# Patient Record
Sex: Male | Born: 1945 | Race: White | Hispanic: No | Marital: Married | Smoking: Former smoker
Health system: Southern US, Community
[De-identification: ages and names within clinical notes are randomized; demographics above are authoritative.]

## PROBLEM LIST (undated history)

## (undated) DIAGNOSIS — I2510 Atherosclerotic heart disease of native coronary artery without angina pectoris: Secondary | ICD-10-CM

## (undated) DIAGNOSIS — N179 Acute kidney failure, unspecified: Secondary | ICD-10-CM

## (undated) DIAGNOSIS — M25552 Pain in left hip: Secondary | ICD-10-CM

## (undated) DIAGNOSIS — E119 Type 2 diabetes mellitus without complications: Secondary | ICD-10-CM

## (undated) DIAGNOSIS — R609 Edema, unspecified: Secondary | ICD-10-CM

## (undated) DIAGNOSIS — I480 Paroxysmal atrial fibrillation: Secondary | ICD-10-CM

## (undated) DIAGNOSIS — I739 Peripheral vascular disease, unspecified: Secondary | ICD-10-CM

## (undated) DIAGNOSIS — I10 Essential (primary) hypertension: Secondary | ICD-10-CM

## (undated) DIAGNOSIS — N400 Enlarged prostate without lower urinary tract symptoms: Secondary | ICD-10-CM

## (undated) DIAGNOSIS — E785 Hyperlipidemia, unspecified: Secondary | ICD-10-CM

## (undated) DIAGNOSIS — I639 Cerebral infarction, unspecified: Secondary | ICD-10-CM

## (undated) DIAGNOSIS — M25551 Pain in right hip: Secondary | ICD-10-CM

## (undated) DIAGNOSIS — Z951 Presence of aortocoronary bypass graft: Secondary | ICD-10-CM

## (undated) DIAGNOSIS — R748 Abnormal levels of other serum enzymes: Secondary | ICD-10-CM

## (undated) DIAGNOSIS — I509 Heart failure, unspecified: Secondary | ICD-10-CM

## (undated) DIAGNOSIS — D649 Anemia, unspecified: Secondary | ICD-10-CM

## (undated) DIAGNOSIS — R600 Localized edema: Secondary | ICD-10-CM

## (undated) DIAGNOSIS — G4730 Sleep apnea, unspecified: Secondary | ICD-10-CM

## (undated) HISTORY — DX: Hyperlipidemia, unspecified: E78.5

## (undated) HISTORY — DX: Localized edema: R60.0

## (undated) HISTORY — DX: Abnormal levels of other serum enzymes: R74.8

## (undated) HISTORY — DX: Acute kidney failure, unspecified: N17.9

## (undated) HISTORY — DX: Paroxysmal atrial fibrillation: I48.0

## (undated) HISTORY — DX: Pain in left hip: M25.552

## (undated) HISTORY — DX: Edema, unspecified: R60.9

## (undated) HISTORY — DX: Presence of aortocoronary bypass graft: Z95.1

## (undated) HISTORY — DX: Type 2 diabetes mellitus without complications: E11.9

## (undated) HISTORY — DX: Sleep apnea, unspecified: G47.30

## (undated) HISTORY — DX: Pain in right hip: M25.551

## (undated) HISTORY — DX: Peripheral vascular disease, unspecified: I73.9

## (undated) HISTORY — PX: OTHER SURGICAL HISTORY: SHX169

## (undated) HISTORY — DX: Anemia, unspecified: D64.9

## (undated) HISTORY — DX: Heart failure, unspecified: I50.9

## (undated) HISTORY — DX: Essential (primary) hypertension: I10

## (undated) HISTORY — DX: Cerebral infarction, unspecified: I63.9

## (undated) HISTORY — DX: Atherosclerotic heart disease of native coronary artery without angina pectoris: I25.10

## (undated) HISTORY — DX: Benign prostatic hyperplasia without lower urinary tract symptoms: N40.0

---

## 2017-07-13 DIAGNOSIS — N183 Chronic kidney disease, stage 3 unspecified: Secondary | ICD-10-CM | POA: Insufficient documentation

## 2019-06-11 DIAGNOSIS — G250 Essential tremor: Secondary | ICD-10-CM | POA: Insufficient documentation

## 2019-12-18 DIAGNOSIS — H9209 Otalgia, unspecified ear: Secondary | ICD-10-CM | POA: Insufficient documentation

## 2020-02-15 LAB — COLOGUARD: Cologuard: NEGATIVE

## 2020-04-30 ENCOUNTER — Encounter (INDEPENDENT_AMBULATORY_CARE_PROVIDER_SITE_OTHER): Payer: Medicare Other | Admitting: Ophthalmology

## 2020-04-30 ENCOUNTER — Other Ambulatory Visit: Payer: Self-pay

## 2020-04-30 DIAGNOSIS — E11311 Type 2 diabetes mellitus with unspecified diabetic retinopathy with macular edema: Secondary | ICD-10-CM | POA: Diagnosis not present

## 2020-04-30 DIAGNOSIS — H43813 Vitreous degeneration, bilateral: Secondary | ICD-10-CM

## 2020-04-30 DIAGNOSIS — I10 Essential (primary) hypertension: Secondary | ICD-10-CM

## 2020-04-30 DIAGNOSIS — H35371 Puckering of macula, right eye: Secondary | ICD-10-CM

## 2020-04-30 DIAGNOSIS — H35033 Hypertensive retinopathy, bilateral: Secondary | ICD-10-CM | POA: Diagnosis not present

## 2020-04-30 DIAGNOSIS — E113313 Type 2 diabetes mellitus with moderate nonproliferative diabetic retinopathy with macular edema, bilateral: Secondary | ICD-10-CM | POA: Diagnosis not present

## 2020-06-05 ENCOUNTER — Telehealth: Payer: Self-pay

## 2020-06-05 NOTE — Telephone Encounter (Signed)
NOTES ON FILE FROM  [facility] [phone] REFERRAL TO SCHEDULING

## 2020-08-06 ENCOUNTER — Other Ambulatory Visit: Payer: Self-pay

## 2020-08-06 ENCOUNTER — Encounter: Payer: Self-pay | Admitting: Cardiology

## 2020-08-06 ENCOUNTER — Ambulatory Visit: Payer: Medicare Other | Admitting: Cardiology

## 2020-08-06 VITALS — BP 124/68 | HR 68 | Ht 72.0 in | Wt 230.0 lb

## 2020-08-06 DIAGNOSIS — I2510 Atherosclerotic heart disease of native coronary artery without angina pectoris: Secondary | ICD-10-CM | POA: Diagnosis not present

## 2020-08-06 DIAGNOSIS — I5043 Acute on chronic combined systolic (congestive) and diastolic (congestive) heart failure: Secondary | ICD-10-CM | POA: Diagnosis not present

## 2020-08-06 NOTE — Progress Notes (Signed)
Cardiology Office Note:    Date:  08/06/2020   ID:  Corey Richards, DOB 1946/01/08, MRN [id number]  PCP:  Seward Carol, MD  [facility] Cardiologist:  Candee Furbish, MD  [facility] Electrophysiologist:  None   Referring MD: Catalina Antigua, MD    History of Present Illness:    Corey Richards is a 74 y.o. male here to establish care status post bypass x2 as well as peripheral arterial disease status post bilateral stents x2.  Had CABG in November 2018 as well as perioperative paroxysmal atrial fibrillation.  Amiodarone was stopped postoperatively and 30-day event monitor showed sinus rhythm with no atrial fibrillation.  Occasional PACs and PVCs were noted all asymptomatic.  No evidence of congestive heart failure no side effects with statin.  Soft murmur heard chronically.  Stroke in 2016 - Plavix stopped and had a stroke. OK to stop ASA.  Overall doing well.  They are downsizing, moving to be with children in [place].    Past Medical History:  Diagnosis Date  . Abnormal liver enzymes   . Acute renal failure (ARF) ([place])   . Anemia   . Benign prostatic hyperplasia   . CHF (congestive heart failure) ([place])   . Coronary arteriosclerosis   . Diabetes mellitus without complication ([place])   . Hyperlipidemia   . Hypertension   . Pain in joint of left hip   . Pain in joint of right hip   . Paroxysmal atrial fibrillation (HCC)   . Peripheral edema   . Peripheral vascular disease ([place])   . 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 Sleep apnea   . Stroke [facility])     Past Surgical History:  Procedure Laterality Date  . cataract surgery    . coronary artery bypass grafts N/A   . peripheral angiography Bilateral     Current Medications: Current Meds  Medication Sig  . atorvastatin (LIPITOR) 40 MG tablet Take 40 mg by mouth at bedtime.  . buprenorphine (BUTRANS) 10 MCG/HR PTWK Place 1 patch onto the skin once a week. Apply 1 patch every week by transdermal  route  . calcitRIOL (ROCALTROL) 0.25 MCG capsule Take 0.25 mcg by mouth daily.  . carvedilol (COREG) 12.5 MG tablet Take 12.5 mg by mouth 2 (two) times daily with a meal.  . clopidogrel (PLAVIX) 75 MG tablet Take 75 mg by mouth daily.  . empagliflozin (JARDIANCE) 10 MG TABS tablet Take 10 mg by mouth daily.  . furosemide (LASIX) 40 MG tablet Take 40 mg by mouth daily. Take 1 tab (40mg  total) in the AM  . primidone (MYSOLINE) 50 MG tablet Take 50 mg by mouth 2 (two) times daily after a meal.  . sacubitril-valsartan (ENTRESTO) 97-103 MG Take 1 tablet by mouth 2 (two) times daily.  . sodium bicarbonate 650 MG tablet Take 648 mg by mouth 4 (four) times daily.  Marland Kitchen terazosin (HYTRIN) 10 MG capsule Take 10 mg by mouth daily. Take 10mg  tab daily - along with 5mg  tab  . terazosin (HYTRIN) 5 MG capsule Take 5 mg by mouth daily. Take 5mg  tab along with 10mg  tab  . Vitamin D, Cholecalciferol, 25 MCG (1000 UT) CAPS Take 1 capsule by mouth daily.  . [DISCONTINUED] aspirin EC 81 MG tablet Take 81 mg by mouth daily. Swallow whole.     Allergies:   Patient has no known allergies.   Social History   Socioeconomic History  . Marital status: Married    Spouse  name: Not on file  . Number of children: Not on file  . Years of education: Not on file  . Highest education level: Not on file  Occupational History  . Not on file  Tobacco Use  . Smoking status: Former Smoker    Years: 10.00    Types: Cigars    Start date: 07/24/1969    Quit date: 07/24/1981    Years since quitting: 39.0  . Smokeless tobacco: Never Used  Vaping Use  . Vaping Use: Never used  Substance and Sexual Activity  . Alcohol use: Never  . Drug use: Not on file  . Sexual activity: Yes  Other Topics Concern  . Not on file  Social History Narrative  . Not on file   Social Determinants of Health   Financial Resource Strain:   . Difficulty of Paying Living Expenses: Not on file  Food Insecurity:   . Worried About Sales executive in the Last Year: Not on file  . Ran Out of Food in the Last Year: Not on file  Transportation Needs:   . Lack of Transportation (Medical): Not on file  . Lack of Transportation (Non-Medical): Not on file  Physical Activity:   . Days of Exercise per Week: Not on file  . Minutes of Exercise per Session: Not on file  Stress:   . Feeling of Stress : Not on file  Social Connections:   . Frequency of Communication with Friends and Family: Not on file  . Frequency of Social Gatherings with Friends and Family: Not on file  . Attends Religious Services: Not on file  . Active Member of Clubs or Organizations: Not on file  . Attends Archivist Meetings: Not on file  . Marital Status: Not on file     Family History: The patient's family history includes Alzheimer's disease in his father; Heart attack in his father.  ROS:   Please see the history of present illness.    No fevers chills nausea vomiting syncope bleeding all other systems reviewed and are negative.  EKGs/Labs/Other Studies Reviewed:    The following studies were reviewed today: Prior Zio monitor showed no atrial fibrillation  EKG:  EKG is  ordered today.  The ekg ordered today demonstrates sinus rhythm 68 poor R wave progression nonspecific ST-T wave changes  Recent Labs: No results found for requested labs within last 8760 hours.  Recent Lipid Panel No results found for: CHOL, TRIG, HDL, CHOLHDL, VLDL, LDLCALC, LDLDIRECT  Physical Exam:    VS:  BP 124/68   Pulse 68   Ht 6' (1.829 m)   Wt 230 lb (104.3 kg)   SpO2 98%   BMI 31.19 kg/m     Wt Readings from Last 3 Encounters:  08/06/20 230 lb (104.3 kg)     GEN:  Well nourished, well developed in no acute distress HEENT: Normal NECK: No JVD; No carotid bruits LYMPHATICS: No lymphadenopathy CARDIAC: RRR, no murmurs, rubs, gallops RESPIRATORY:  Clear to auscultation without rales, wheezing or rhonchi  ABDOMEN: Soft, non-tender,  non-distended MUSCULOSKELETAL:  No edema; No deformity  SKIN: Warm and dry NEUROLOGIC:  Alert and oriented x 3 PSYCHIATRIC:  Normal affect   ASSESSMENT:    1. Acute on chronic combined systolic and diastolic CHF (congestive heart failure) ([place])   2. Coronary artery disease involving native coronary artery of native heart without angina pectoris    PLAN:    In order of problems listed above:  Coronary  artery disease -Post bypass 2018.  Two-vessel.  Overall doing well without any anginal symptoms. -Had myocardial infarction in 2000 with an RCA stent placed 5 x 18 MultiLink, then MI in 2002 with stent placed to posterior lateral branch 2.5 x 13 MultiLink.  Paroxysmal atrial fibrillation postoperatively -Off of amiodarone.  Doing well.  Essential hypertension -Stable, medications reviewed.  Prior EKG showed sinus rhythm 63 late transition with inferior lateral T wave changes.  PVD --bilateral leg stents-continue with Plavix.  Prior stroke -Had a stroke after he had stopped his Plavix for 2 weeks.  We will continue with Plavix monotherapy.  I think it is reasonable to stop the aspirin 81 mg to help reduce bleeding risks.  He does have some bruising on his arm.  Diabetes with hyperlipidemia -Continue with atorvastatin.  LDL goal less than 70.     Medication Adjustments/Labs and Tests Ordered: Current medicines are reviewed at length with the patient today.  Concerns regarding medicines are outlined above.  Orders Placed This Encounter  Procedures  . EKG 12-Lead  . ECHOCARDIOGRAM COMPLETE   No orders of the defined types were placed in this encounter.   Patient Instructions  Medication Instructions:  Please discontinue your Aspirin.  Continue all other medications as listed.  *If you need a refill on your cardiac medications before your next appointment, please call your pharmacy*  Testing/Procedures: Your physician has requested that you have an echocardiogram.  Echocardiography is a painless test that uses sound waves to create images of your heart. It provides your doctor with information about the size and shape of your heart and how well your heart's chambers and valves are working. This procedure takes approximately one hour. There are no restrictions for this procedure.  Follow-Up: At [facility], you and your health needs are our priority.  As part of our continuing mission to provide you with exceptional heart care, we have created designated Provider Care Teams.  These Care Teams include your primary Cardiologist (physician) and Advanced Practice Providers (APPs -  Physician Assistants and Nurse Practitioners) who all work together to provide you with the care you need, when you need it.  We recommend signing up for the patient portal called "MyChart".  Sign up information is provided on this After Visit Summary.  MyChart is used to connect with patients for Virtual Visits (Telemedicine).  Patients are able to view lab/test results, encounter notes, upcoming appointments, etc.  Non-urgent messages can be sent to your provider as well.   To learn more about what you can do with MyChart, go to [email or web address].    Your next appointment:   6 month(s)  The format for your next appointment:   In Person  Provider:   Candee Furbish, MD   Thank you for choosing [facility]!!         Signed, Candee Furbish, MD  08/06/2020 3:45 PM    [place]

## 2020-08-06 NOTE — Patient Instructions (Addendum)
Medication Instructions:  Please discontinue your Aspirin.  Continue all other medications as listed.  *If you need a refill on your cardiac medications before your next appointment, please call your pharmacy*  Testing/Procedures: Your physician has requested that you have an echocardiogram. Echocardiography is a painless test that uses sound waves to create images of your heart. It provides your doctor with information about the size and shape of your heart and how well your heart's chambers and valves are working. This procedure takes approximately one hour. There are no restrictions for this procedure.  Follow-Up: At [facility], you and your health needs are our priority.  As part of our continuing mission to provide you with exceptional heart care, we have created designated Provider Care Teams.  These Care Teams include your primary Cardiologist (physician) and Advanced Practice Providers (APPs -  Physician Assistants and Nurse Practitioners) who all work together to provide you with the care you need, when you need it.  We recommend signing up for the patient portal called "MyChart".  Sign up information is provided on this After Visit Summary.  MyChart is used to connect with patients for Virtual Visits (Telemedicine).  Patients are able to view lab/test results, encounter notes, upcoming appointments, etc.  Non-urgent messages can be sent to your provider as well.   To learn more about what you can do with MyChart, go to [email or web address].    Your next appointment:   6 month(s)  The format for your next appointment:   In Person  Provider:   Candee Furbish, MD   Thank you for choosing [facility]!!

## 2020-08-24 ENCOUNTER — Ambulatory Visit (HOSPITAL_COMMUNITY): Payer: Medicare Other | Attending: Cardiology

## 2020-08-24 ENCOUNTER — Other Ambulatory Visit: Payer: Self-pay

## 2020-08-24 DIAGNOSIS — I2510 Atherosclerotic heart disease of native coronary artery without angina pectoris: Secondary | ICD-10-CM | POA: Insufficient documentation

## 2020-08-24 LAB — ECHOCARDIOGRAM COMPLETE
AR max vel: 1.76 cm2
AV Area VTI: 1.53 cm2
AV Area mean vel: 1.83 cm2
AV Mean grad: 4 mmHg
AV Peak grad: 7.5 mmHg
Ao pk vel: 1.37 m/s
Area-P 1/2: 3.5 cm2
S' Lateral: 5.18 cm

## 2020-09-07 ENCOUNTER — Encounter (INDEPENDENT_AMBULATORY_CARE_PROVIDER_SITE_OTHER): Payer: Medicare Other | Admitting: Ophthalmology

## 2020-09-28 ENCOUNTER — Other Ambulatory Visit: Payer: Self-pay | Admitting: *Deleted

## 2020-09-28 DIAGNOSIS — I739 Peripheral vascular disease, unspecified: Secondary | ICD-10-CM

## 2020-10-12 ENCOUNTER — Encounter (INDEPENDENT_AMBULATORY_CARE_PROVIDER_SITE_OTHER): Payer: Medicare Other | Admitting: Ophthalmology

## 2020-10-12 ENCOUNTER — Other Ambulatory Visit: Payer: Self-pay

## 2020-10-12 DIAGNOSIS — H35033 Hypertensive retinopathy, bilateral: Secondary | ICD-10-CM

## 2020-10-12 DIAGNOSIS — E113393 Type 2 diabetes mellitus with moderate nonproliferative diabetic retinopathy without macular edema, bilateral: Secondary | ICD-10-CM | POA: Diagnosis not present

## 2020-10-12 DIAGNOSIS — E11319 Type 2 diabetes mellitus with unspecified diabetic retinopathy without macular edema: Secondary | ICD-10-CM | POA: Diagnosis not present

## 2020-10-12 DIAGNOSIS — H43813 Vitreous degeneration, bilateral: Secondary | ICD-10-CM

## 2020-10-12 DIAGNOSIS — I10 Essential (primary) hypertension: Secondary | ICD-10-CM

## 2020-10-12 DIAGNOSIS — H35371 Puckering of macula, right eye: Secondary | ICD-10-CM

## 2020-10-13 ENCOUNTER — Ambulatory Visit (HOSPITAL_COMMUNITY)
Admission: RE | Admit: 2020-10-13 | Discharge: 2020-10-13 | Disposition: A | Discharge status: Home / Self Care | Payer: Medicare Other | Source: Ambulatory Visit | Attending: Vascular Surgery | Admitting: Vascular Surgery

## 2020-10-13 ENCOUNTER — Ambulatory Visit: Payer: Self-pay | Admitting: Vascular Surgery

## 2020-10-13 ENCOUNTER — Encounter: Payer: Self-pay | Admitting: Vascular Surgery

## 2020-10-13 DIAGNOSIS — I739 Peripheral vascular disease, unspecified: Secondary | ICD-10-CM | POA: Diagnosis present

## 2020-10-13 NOTE — Progress Notes (Signed)
Patient refused to be weighed. When checking patient in to see the Dr I reminded patient to keep his mask above his nose patient said no he cant breath with that on. Asked patient to change into shorts so Dr Carlis Abbott could exam him he refused I explained to him that Dr Carlis Abbott would need him to change so he can do his exam. Advised patient we are running behind and he had a patient to be seen ahead of him. Patient came out of the room and stated he wasn't going to wait and left without being seen by Dr Carlis Abbott.  Unfortunately patient left prior to being seen today.  Per nursing, he stated he did not want to wait any longer.  There will be no charge for today's visit.  Marty Heck, MD [facility] [place] Office: [phone]

## 2020-10-14 ENCOUNTER — Telehealth: Payer: Self-pay | Admitting: Cardiology

## 2020-10-14 ENCOUNTER — Other Ambulatory Visit: Payer: Self-pay | Admitting: Cardiology

## 2020-10-14 MED ORDER — TERAZOSIN HCL 5 MG PO CAPS
5.0000 mg | ORAL_CAPSULE | Freq: Every day | ORAL | 2 refills | Status: DC
Start: 2020-10-14 — End: 2021-02-01

## 2020-10-14 MED ORDER — TERAZOSIN HCL 10 MG PO CAPS
10.0000 mg | ORAL_CAPSULE | Freq: Every day | ORAL | 2 refills | Status: DC
Start: 2020-10-14 — End: 2021-02-01

## 2020-10-14 NOTE — Telephone Encounter (Signed)
Pt calling stating that he would like for Dr. Marlou Porch to refill his medications terazosin 5 mg and 10 mg capsules. Pt would like for Dr. Marlou Porch nurse to give him a call back concerning this matter. Pt also stated that his previous cardiologist prescribed this medication. Please address

## 2020-10-14 NOTE — Telephone Encounter (Signed)
Spoke with patient after verifying him by name and DOB.  Pt wanted to make sure his refills had been sent into [facility] as requested but then stated he had listened to my previous VM and knew it had been taken care of.  He was grateful for the call back.  He had no further requests or questions at the time of the call.

## 2020-10-14 NOTE — Telephone Encounter (Signed)
*  STAT* If patient is at the pharmacy, call can be transferred to refill team.   1. Which medications need to be refilled? (please list name of each medication and dose if known) terazosin (HYTRIN) 10 MG capsule  2. Which pharmacy/location (including street and city if local pharmacy) is medication to be sent to? [place], [address], [address]  3. Do they need a 30 day or 90 day supply? 90 day supply  Patient states he is completely out of medication.

## 2020-10-14 NOTE — Telephone Encounter (Signed)
New message:    Patient calling concerning some medication. Please call patient back. Patient would like for some one to also would like some one to call call [facility] [phone]

## 2020-10-14 NOTE — Telephone Encounter (Signed)
Attempted to contact patient by phone.  NA - lm  (OK per DPR on file) that refills will be sent into [facility] as requested as pt takes Hytrin for HTN.  Advised to c/b if any further questions or concerns.

## 2020-11-10 ENCOUNTER — Other Ambulatory Visit: Payer: Self-pay

## 2020-11-10 ENCOUNTER — Encounter: Payer: Self-pay | Admitting: Vascular Surgery

## 2020-11-10 ENCOUNTER — Ambulatory Visit (INDEPENDENT_AMBULATORY_CARE_PROVIDER_SITE_OTHER): Payer: Medicare Other | Admitting: Vascular Surgery

## 2020-11-10 VITALS — BP 143/73 | HR 71 | Temp 97.7°F | Resp 20 | Ht 72.0 in | Wt 240.0 lb

## 2020-11-10 DIAGNOSIS — I739 Peripheral vascular disease, unspecified: Secondary | ICD-10-CM

## 2020-11-10 NOTE — Progress Notes (Signed)
ASSESSMENT & PLAN:  74 y.o. male with currently asymptomatic peripheral arterial disease s/p endovascular intervention in [place].   Recommend:  Complete cessation from all tobacco products. Blood glucose control with goal A1c < 7%. Blood pressure control with goal blood pressure < 140/90 mmHg. Lipid reduction therapy with goal LDL-C <100 mg/dL (<70 if symptomatic from PAD).  Clopidogrel 75mg  PO QD. Atorvastatin 40-80mg  PO QD (or other "high intensity" statin therapy).  Follow up 1 year with repeat ABI and arterial duplex.  CHIEF COMPLAINT:   Establish vascular care  HISTORY:  HISTORY OF PRESENT ILLNESS: Corey Richards is a 74 y.o. male with history of DM, HF, HLA, HTN, AF, CAD s/p CABG. he has a strong history of peripheral arterial disease.  He is undergone multiple endovascular interventions in [place] near [place] with Dr Jaclyn Shaggy.  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  His right fifth toe is also surgically absent.  He is ambulatory.  He is a bit unsteady and ambulates with a cane.  He denies location, ischemic rest pain, ischemic ulceration.  Past Medical History:  Diagnosis Date  . Abnormal liver enzymes   . Acute renal failure (ARF) ([place])   . Anemia   . Benign prostatic hyperplasia   . CHF (congestive heart failure) ([place])   . Coronary arteriosclerosis   . Diabetes mellitus without complication ([place])   . Hyperlipidemia   . Hypertension   . Pain in joint of left hip   . Pain in joint of right hip   . Paroxysmal atrial fibrillation (HCC)   . Peripheral edema   . Peripheral vascular disease ([place])   . 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 Sleep apnea   . Stroke [facility])     Past Surgical History:  Procedure Laterality Date  . cataract surgery    . coronary artery bypass grafts N/A   . peripheral angiography Bilateral     Family History  Problem Relation Age of Onset  . Alzheimer's disease Father    . Heart attack Father     Social History   Socioeconomic History  . Marital status: Married    Spouse name: Not on file  . Number of children: Not on file  . Years of education: Not on file  . Highest education level: Not on file  Occupational History  . Not on file  Tobacco Use  . Smoking status: Former Smoker    Years: 10.00    Types: Cigars    Start date: 07/24/1969    Quit date: 07/24/1981    Years since quitting: 39.3  . Smokeless tobacco: Never Used  Vaping Use  . Vaping Use: Never used  Substance and Sexual Activity  . Alcohol use: Never  . Drug use: Not on file  . Sexual activity: Yes  Other Topics Concern  . Not on file  Social History Narrative  . Not on file   Social Determinants of Health   Financial Resource Strain:   . Difficulty of Paying Living Expenses: Not on file  Food Insecurity:   . Worried About Charity fundraiser in the Last Year: Not on file  . Ran Out of Food in the Last Year: Not on file  Transportation Needs:   . Lack of Transportation (Medical): Not on file  . Lack of Transportation (Non-Medical): Not on file  Physical Activity:   . Days of Exercise per Week: Not on  file  . Minutes of Exercise per Session: Not on file  Stress:   . Feeling of Stress : Not on file  Social Connections:   . Frequency of Communication with Friends and Family: Not on file  . Frequency of Social Gatherings with Friends and Family: Not on file  . Attends Religious Services: Not on file  . Active Member of Clubs or Organizations: Not on file  . Attends Archivist Meetings: Not on file  . Marital Status: Not on file  Intimate Partner Violence:   . Fear of Current or Ex-Partner: Not on file  . Emotionally Abused: Not on file  . Physically Abused: Not on file  . Sexually Abused: Not on file    No Known Allergies  Current Outpatient Medications  Medication Sig Dispense Refill  . atorvastatin (LIPITOR) 40 MG tablet Take 40 mg by mouth at  bedtime.    . buprenorphine (BUTRANS) 10 MCG/HR PTWK Place 1 patch onto the skin once a week. Apply 1 patch every week by transdermal route    . calcitRIOL (ROCALTROL) 0.25 MCG capsule Take 0.25 mcg by mouth daily.    . carvedilol (COREG) 12.5 MG tablet Take 12.5 mg by mouth 2 (two) times daily with a meal.    . clopidogrel (PLAVIX) 75 MG tablet Take 75 mg by mouth daily.    . empagliflozin (JARDIANCE) 10 MG TABS tablet Take 10 mg by mouth daily.    . furosemide (LASIX) 40 MG tablet Take 40 mg by mouth daily. Take 1 tab ($Remo'40mg'DULgY$  total) in the AM    . primidone (MYSOLINE) 50 MG tablet Take 50 mg by mouth 2 (two) times daily after a meal.    . sacubitril-valsartan (ENTRESTO) 97-103 MG Take 1 tablet by mouth 2 (two) times daily.    . sodium bicarbonate 650 MG tablet Take 648 mg by mouth 4 (four) times daily.    Marland Kitchen terazosin (HYTRIN) 10 MG capsule Take 1 capsule (10 mg total) by mouth daily. Take $RemoveBef'10mg'mUouMVcSok$  tab daily - along with $RemoveBe'5mg'jSzLzkehq$  tab 90 capsule 2  . terazosin (HYTRIN) 5 MG capsule Take 1 capsule (5 mg total) by mouth daily. Take $RemoveBef'5mg'yymbqWWdjb$  tab along with $RemoveBe'10mg'OyYRPViRO$  tab 90 capsule 2  . Vitamin D, Cholecalciferol, 25 MCG (1000 UT) CAPS Take 1 capsule by mouth daily.     No current facility-administered medications for this visit.    REVIEW OF SYSTEMS:  $RemoveB'[X]'HSVsZHZx$  denotes positive finding, $RemoveBeforeDEI'[ ]'VhSjJcCdrbyJXjXU$  denotes negative finding Cardiac  Comments:  Chest pain or chest pressure:    Shortness of breath upon exertion:    Short of breath when lying flat:    Irregular heart rhythm:        Vascular    Pain in calf, thigh, or hip brought on by ambulation:    Pain in feet at night that wakes you up from your sleep:     Blood clot in your veins:    Leg swelling:         Pulmonary    Oxygen at home:    Productive cough:     Wheezing:         Neurologic    Sudden weakness in arms or legs:     Sudden numbness in arms or legs:     Sudden onset of difficulty speaking or slurred speech:    Temporary loss of vision in one eye:       Problems with dizziness:         Gastrointestinal  Blood in stool:     Vomited blood:         Genitourinary    Burning when urinating:     Blood in urine:        Psychiatric    Major depression:         Hematologic    Bleeding problems:    Problems with blood clotting too easily:        Skin    Rashes or ulcers:        Constitutional    Fever or chills:     PHYSICAL EXAM:   Vitals:   11/10/20 1330  BP: (!) 143/73  Pulse: 71  Resp: 20  Temp: 97.7 F (36.5 C)  SpO2: 99%  Weight: 240 lb (108.9 kg)  Height: 6' (1.829 m)   Constitutional: Well appearing in no distress. Appears well nourished.  Neurologic: Normal gait and station. CN intact. No weakness. No sensory loss. Psychiatric: Mood and affect symmetric and appropriate. Eyes: No icterus. No conjunctival pallor. Ears, nose, throat: mucous membranes moist. Midline trachea. No carotid bruit. Cardiac: regular rate and rhythm.  Respiratory: unlabored. Abdominal: soft, non-tender, non-distended. No palpable pulsatile abdominal mass. Peripheral vascular:  Dorsalis pedis pulse: L absent / R absent  Posterior tibial pulse: L absent / R absent  <2s capillary refill  Left toes 1-5 surgically absent  Right 5th toe surgically absent Extremity: No edema. No cyanosis. No pallor.  Skin: No gangrene. No ulceration.  Lymphatic: No Stemmer's sign. No palpable lymphadenopathy.   DATA REVIEW:   ABI 10/13/20  LOWER EXTREMITY DOPPLER STUDY   Indications: Peripheral artery disease, and Left great toe amputation.   Other Factors: Hx Bilateral stenting at outside facility in [facility].  Comparison Study: no priors at this facility   Performing Technologist: June Leap RDMS, RVT     Examination Guidelines: A complete evaluation includes at minimum, Doppler  waveform signals and systolic blood pressure reading at the level of  bilateral  brachial, anterior tibial, and posterior tibial arteries, when vessel  segments  are  accessible. Bilateral testing is considered an integral part of a  complete  examination. Photoelectric Plethysmograph (PPG) waveforms and toe systolic  pressure readings are included as required and additional duplex testing  as  needed. Limited examinations for reoccurring indications may be performed  as  noted.     ABI Findings:  +---------+------------------+-----+----------+--------+  Right  Rt Pressure (mmHg)IndexWaveform Comment   +---------+------------------+-----+----------+--------+  Brachial 157                      +---------+------------------+-----+----------+--------+  ATA   189        1.15 monophasic      +---------+------------------+-----+----------+--------+  PTA   147        0.89 monophasic      +---------+------------------+-----+----------+--------+  Great Toe73        0.44 Abnormal       +---------+------------------+-----+----------+--------+   +--------+------------------+-----+-------------------+-------+  Left  Lt Pressure (mmHg)IndexWaveform      Comment  +--------+------------------+-----+-------------------+-------+  [id number]                           +--------+------------------+-----+-------------------+-------+  ATA   255        1.55 biphasic            +--------+------------------+-----+-------------------+-------+  PTA   81        0.49 dampened monophasic      +--------+------------------+-----+-------------------+-------+  Summary:  Right: Resting right ankle-brachial index is within normal range. ABIs are  unreliable.  Although ankle brachial indices are within normal limits (0.95-1.29),  arterial Doppler waveforms at the ankle suggest some component of arterial  occlusive disease.  Left: Resting left ankle-brachial index indicates noncompressible left   lower extremity arteries. Arterial Doppler waveforms at the ankle suggest  some component of arterial occlusive disease.        *See table(s) above for measurements and observations.   Yevonne Aline. Stanford Breed, MD [facility] Phone Number: [phone] 11/10/2020 1:35 PM

## 2020-11-16 ENCOUNTER — Other Ambulatory Visit: Payer: Self-pay

## 2020-11-16 ENCOUNTER — Telehealth: Payer: Self-pay | Admitting: Cardiology

## 2020-11-16 NOTE — Telephone Encounter (Signed)
Pt c/o medication issue:  1. Name of Medication: sacubitril-valsartan (ENTRESTO) 97-103 MG  2. How are you currently taking this medication (dosage and times per day)? 1 tablet twice a day  3. Are you having a reaction (difficulty breathing--STAT)? no  4. What is your medication issue? Patient states the office needs to call [employer] at [phone] so he can get the medication.

## 2020-11-16 NOTE — Telephone Encounter (Signed)
Attempted to contact patient regarding pt assistance for his Entresto.  Advised there is a form online that he can print out, complete and include all required information (financial info).  Once that has been completed Dr Marlou Porch will need to complete the RX information, sign, date and have faxed into the company for approval of patient assistance.  Requested he call back if questions/concerns.

## 2020-11-26 NOTE — Telephone Encounter (Signed)
Pt has not called back.  Will close this encounter for now and await a call back if pt needs an further assistance.

## 2020-11-27 NOTE — Telephone Encounter (Signed)
Pt Asst application for Delene Loll has been completed, signed and faxed to [employer].

## 2020-12-22 NOTE — Telephone Encounter (Signed)
I called and spoke to pt. I told him I would not be back in the office until Friday but that I would re-fax it for him at that time.

## 2020-12-22 NOTE — Telephone Encounter (Signed)
Patient is following up regarding the status of [employer] Patient Assistance request. He states, per [employer], they received his application, however, the print was illegible and blurry. He would like to know if the application can be resubmitted. Please advise.

## 2020-12-25 NOTE — Telephone Encounter (Signed)
I called pt to let him know that I found his paperwork and would re-fax it for him but he stated that he called [employer] back and they were able to accept the one they received and have approved him for another year.

## 2021-01-01 DIAGNOSIS — E119 Type 2 diabetes mellitus without complications: Secondary | ICD-10-CM | POA: Diagnosis not present

## 2021-01-01 DIAGNOSIS — Z794 Long term (current) use of insulin: Secondary | ICD-10-CM | POA: Diagnosis not present

## 2021-01-01 DIAGNOSIS — E1165 Type 2 diabetes mellitus with hyperglycemia: Secondary | ICD-10-CM | POA: Diagnosis not present

## 2021-02-01 ENCOUNTER — Telehealth: Payer: Self-pay | Admitting: Cardiology

## 2021-02-01 DIAGNOSIS — E119 Type 2 diabetes mellitus without complications: Secondary | ICD-10-CM | POA: Diagnosis not present

## 2021-02-01 DIAGNOSIS — E1165 Type 2 diabetes mellitus with hyperglycemia: Secondary | ICD-10-CM | POA: Diagnosis not present

## 2021-02-01 DIAGNOSIS — Z794 Long term (current) use of insulin: Secondary | ICD-10-CM | POA: Diagnosis not present

## 2021-02-01 NOTE — Telephone Encounter (Signed)
Pt c/o medication issue:  1. Name of Medication: furosemide (LASIX) 40 MG tablet   2. How are you currently taking this medication (dosage and times per day)? 2 tablets daily  3. Are you having a reaction (difficulty breathing--STAT)? no  4. What is your medication issue? Patient states he takes 2 tablets daily, not one tablet. He states he needs the new prescription sent to [facility].

## 2021-02-01 NOTE — Telephone Encounter (Signed)
Called patient back about his request. Patient stated he is taking lasix 40 mg by mouth daily, but sometimes he needs to take it twice daily for edema. Informed patient that our office has never filled this medications with prn dose for edema. Patient stated his last cardiologist did at [facility]. Informed patient that we would send message to Dr. Marlou Porch to see if we can get a new medication order to reflect this.

## 2021-02-01 NOTE — Telephone Encounter (Signed)
*  STAT* If patient is at the pharmacy, call can be transferred to refill team.   1. Which medications need to be refilled? (please list name of each medication and dose if known)  terazosin (HYTRIN) 5 MG capsule terazosin (HYTRIN) 10 MG capsule   2. Which pharmacy/location (including street and city if local pharmacy) is medication to be sent to? [place], [address], [address]  3. Do they need a 30 day or 90 day supply? 90 day

## 2021-02-02 MED ORDER — TERAZOSIN HCL 10 MG PO CAPS
10.0000 mg | ORAL_CAPSULE | Freq: Every day | ORAL | 2 refills | Status: DC
Start: 2021-02-02 — End: 2021-11-09

## 2021-02-02 MED ORDER — FUROSEMIDE 40 MG PO TABS
40.0000 mg | ORAL_TABLET | Freq: Every day | ORAL | 2 refills | Status: DC
Start: 2021-02-02 — End: 2021-04-27

## 2021-02-02 MED ORDER — TERAZOSIN HCL 5 MG PO CAPS
5.0000 mg | ORAL_CAPSULE | Freq: Every day | ORAL | 2 refills | Status: DC
Start: 2021-02-02 — End: 2021-11-09

## 2021-02-02 NOTE — Telephone Encounter (Signed)
Ordered refills for patient's lasix and terazosin.

## 2021-02-02 NOTE — Telephone Encounter (Signed)
I am comfortable with him getting as needed Lasix 40 mg as well.  Thank you. Candee Furbish, MD

## 2021-02-08 DIAGNOSIS — Z794 Long term (current) use of insulin: Secondary | ICD-10-CM | POA: Diagnosis not present

## 2021-02-08 DIAGNOSIS — I739 Peripheral vascular disease, unspecified: Secondary | ICD-10-CM | POA: Diagnosis not present

## 2021-02-08 DIAGNOSIS — I2510 Atherosclerotic heart disease of native coronary artery without angina pectoris: Secondary | ICD-10-CM | POA: Diagnosis not present

## 2021-02-08 DIAGNOSIS — E11649 Type 2 diabetes mellitus with hypoglycemia without coma: Secondary | ICD-10-CM | POA: Diagnosis not present

## 2021-02-08 DIAGNOSIS — Z7984 Long term (current) use of oral hypoglycemic drugs: Secondary | ICD-10-CM | POA: Diagnosis not present

## 2021-02-08 DIAGNOSIS — E113299 Type 2 diabetes mellitus with mild nonproliferative diabetic retinopathy without macular edema, unspecified eye: Secondary | ICD-10-CM | POA: Diagnosis not present

## 2021-02-09 ENCOUNTER — Ambulatory Visit: Payer: Medicare Other | Admitting: Cardiology

## 2021-02-09 ENCOUNTER — Other Ambulatory Visit: Payer: Self-pay

## 2021-02-09 ENCOUNTER — Encounter: Payer: Self-pay | Admitting: Cardiology

## 2021-02-09 VITALS — BP 120/60 | HR 76 | Ht 72.0 in | Wt 226.0 lb

## 2021-02-09 DIAGNOSIS — I5022 Chronic systolic (congestive) heart failure: Secondary | ICD-10-CM

## 2021-02-09 DIAGNOSIS — I739 Peripheral vascular disease, unspecified: Secondary | ICD-10-CM

## 2021-02-09 DIAGNOSIS — E1151 Type 2 diabetes mellitus with diabetic peripheral angiopathy without gangrene: Secondary | ICD-10-CM | POA: Diagnosis not present

## 2021-02-09 DIAGNOSIS — I2510 Atherosclerotic heart disease of native coronary artery without angina pectoris: Secondary | ICD-10-CM | POA: Diagnosis not present

## 2021-02-09 NOTE — Progress Notes (Signed)
Cardiology Office Note:    Date:  02/09/2021   ID:  Corey Richards, DOB 01-02-1946, MRN [id number]  PCP:  Seward Carol, MD   [place]  Cardiologist:  Candee Furbish, MD  Advanced Practice Provider:  No care team member to display Electrophysiologist:  None       Referring MD: Seward Carol, MD     History of Present Illness:    Corey Richards is a 75 y.o. male here for CAD follow-up post CABG November 2018 and perioperative paroxysmal atrial fibrillation.  Amiodarone finally stopped after 30-day event monitor showed sinus rhythm.  He had a stroke in 2016 prior to CABG.  Past Medical History:  Diagnosis Date  . Abnormal liver enzymes   . Acute renal failure (ARF) ([place])   . Anemia   . Benign prostatic hyperplasia   . CHF (congestive heart failure) ([place])   . Coronary arteriosclerosis   . Diabetes mellitus without complication ([place] [employer])   . Hyperlipidemia   . Hypertension   . Pain in joint of left hip   . Pain in joint of right hip   . Paroxysmal atrial fibrillation (HCC)   . Peripheral edema   . Peripheral vascular disease ([place])   . 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 Sleep apnea   . Stroke [facility])     Past Surgical History:  Procedure Laterality Date  . cataract surgery    . coronary artery bypass grafts N/A   . peripheral angiography Bilateral     Current Medications: Current Meds  Medication Sig  . atorvastatin (LIPITOR) 40 MG tablet Take 40 mg by mouth at bedtime.  . calcitRIOL (ROCALTROL) 0.25 MCG capsule Take 0.25 mcg by mouth daily.  . carvedilol (COREG) 12.5 MG tablet Take 12.5 mg by mouth 2 (two) times daily with a meal.  . clopidogrel (PLAVIX) 75 MG tablet Take 75 mg by mouth daily.  . Dulaglutide (TRULICITY) 5.88 FO/2.7XA SOPN Inject 0.5 mLs into the skin once a week.  . empagliflozin (JARDIANCE) 10 MG TABS tablet Take 10 mg by mouth daily.  . furosemide (LASIX) 40 MG tablet Take 1 tablet (40 mg total) by  mouth daily. Can take extra 40 mg by mouth daily as needed for edema.  . primidone (MYSOLINE) 50 MG tablet Take 50 mg by mouth 2 (two) times daily after a meal.  . sacubitril-valsartan (ENTRESTO) 97-103 MG Take 1 tablet by mouth 2 (two) times daily.  . sodium bicarbonate 650 MG tablet Take 648 mg by mouth 4 (four) times daily.  Marland Kitchen terazosin (HYTRIN) 10 MG capsule Take 1 capsule (10 mg total) by mouth daily. Take 10mg  tab daily - along with 5mg  tab  . terazosin (HYTRIN) 5 MG capsule Take 1 capsule (5 mg total) by mouth daily. Take 5mg  tab along with 10mg  tab  . Vitamin D, Cholecalciferol, 25 MCG (1000 UT) CAPS Take 1 capsule by mouth daily.     Allergies:   Patient has no known allergies.   Social History   Socioeconomic History  . Marital status: Married    Spouse name: Not on file  . Number of children: Not on file  . Years of education: Not on file  . Highest education level: Not on file  Occupational History  . Not on file  Tobacco Use  . Smoking status: Former Smoker    Years: 10.00    Types: Cigars    Start date: 07/24/1969    Quit date:  07/24/1981    Years since quitting: 39.5  . Smokeless tobacco: Never Used  Vaping Use  . Vaping Use: Never used  Substance and Sexual Activity  . Alcohol use: Never  . Drug use: Not on file  . Sexual activity: Yes  Other Topics Concern  . Not on file  Social History Narrative  . Not on file   Social Determinants of Health   Financial Resource Strain: Not on file  Food Insecurity: Not on file  Transportation Needs: Not on file  Physical Activity: Not on file  Stress: Not on file  Social Connections: Not on file     Family History: The patient's family history includes Alzheimer's disease in his father; Heart attack in his father.  ROS:   Please see the history of present illness.     All other systems reviewed and are negative.  EKGs/Labs/Other Studies Reviewed:    The following studies were reviewed today:  ECHO  2021: 1. Technically challenging study. There is global hypokinesis, but there  also appears to be focal areas of more severe hypokinesis as well.  However, not all walls able to be well visualized. Recommend future  studies be done with echo contrast.. Left  ventricular ejection fraction, by estimation, is 35 to 40%. The left  ventricle has moderately decreased function. The left ventricle  demonstrates regional wall motion abnormalities (see scoring  diagram/findings for description). The left ventricular  internal cavity size was moderately to severely dilated. Left ventricular  diastolic parameters are indeterminate.  2. Right ventricular systolic function was not well visualized. The right  ventricular size is not well visualized. Tricuspid regurgitation signal is  inadequate for assessing PA pressure.  3. Left atrial size was moderately dilated.  4. Right atrial size was moderately dilated.  5. The mitral valve is grossly normal. Trivial mitral valve  regurgitation. No evidence of mitral stenosis.  6. The aortic valve is tricuspid. There is moderate calcification of the  aortic valve. There is mild thickening of the aortic valve. Aortic valve  regurgitation is not visualized. Mild to moderate aortic valve  sclerosis/calcification is present, without  any evidence of aortic stenosis.   Vascular ABIs:10/2020 Right: Resting right ankle-brachial index is within normal range. ABIs are  unreliable.  Although ankle brachial indices are within normal limits (0.95-1.29),  arterial Doppler waveforms at the ankle suggest some component of arterial  occlusive disease.  Left: Resting left ankle-brachial index indicates noncompressible left  lower extremity arteries. Arterial Doppler waveforms at the ankle suggest  some component of arterial occlusive disease.    Recent Labs: No results found for requested labs within last 8760 hours.  Recent Lipid Panel No results found for:  CHOL, TRIG, HDL, CHOLHDL, VLDL, LDLCALC, LDLDIRECT   Risk Assessment/Calculations:      Physical Exam:    VS:  BP 120/60 (BP Location: Left Arm, Patient Position: Sitting, Cuff Size: Normal)   Pulse 76   Ht 6' (1.829 m)   Wt 226 lb (102.5 kg)   SpO2 96%   BMI 30.65 kg/m     Wt Readings from Last 3 Encounters:  02/09/21 226 lb (102.5 kg)  11/10/20 240 lb (108.9 kg)  10/13/20 235 lb (106.6 kg)     GEN:  Well nourished, well developed in no acute distress, ambulates with a cane HEENT: Normal NECK: No JVD; No carotid bruits LYMPHATICS: No lymphadenopathy CARDIAC: RRR, no murmurs, rubs, gallops RESPIRATORY:  Clear to auscultation without rales, wheezing or rhonchi  ABDOMEN:  Soft, non-tender, non-distended MUSCULOSKELETAL:  No edema; No deformity  SKIN: Warm and dry NEUROLOGIC:  Alert and oriented x 3 PSYCHIATRIC:  Normal affect   ASSESSMENT:    1. Coronary artery disease involving native coronary artery of native heart without angina pectoris   2. PVD (peripheral vascular disease) ([place])   3. Chronic systolic heart failure ([place])   4. 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  On excellent medications.  He is not on spironolactone because of chronic kidney disease stage IV.  Continue with Entresto, carvedilol, Jardiance, Lasix  Hyperlipidemia -On atorvastatin 40 mg high intensity statin, excellent.  Diabetes with peripheral vascular disease -New startTrulicity -On Jardiance  Peripheral vascular disease -Currently seeing vascular surgery, Dr. Stanford Breed excellent.  ABIs reviewed as above.  Medication Adjustments/Labs and Tests Ordered: Current medicines are reviewed at length with the patient today.  Concerns regarding medicines are outlined above.  No orders of the defined types were placed in this encounter.  No orders of the defined types  were placed in this encounter.   Patient Instructions  Medication Instructions:  The current medical regimen is effective;  continue present plan and medications.  *If you need a refill on your cardiac medications before your next appointment, please call your pharmacy*  Follow-Up: At [facility], you and your health needs are our priority.  As part of our continuing mission to provide you with exceptional heart care, we have created designated Provider Care Teams.  These Care Teams include your primary Cardiologist (physician) and Advanced Practice Providers (APPs -  Physician Assistants and Nurse Practitioners) who all work together to provide you with the care you need, when you need it.  We recommend signing up for the patient portal called "MyChart".  Sign up information is provided on this After Visit Summary.  MyChart is used to connect with patients for Virtual Visits (Telemedicine).  Patients are able to view lab/test results, encounter notes, upcoming appointments, etc.  Non-urgent messages can be sent to your provider as well.   To learn more about what you can do with MyChart, go to [email or web address].    Your next appointment:   6 month(s)  The format for your next appointment:   In Person  Provider:   Candee Furbish, MD   Thank you for choosing [facility]!!        Signed, Candee Furbish, MD  02/09/2021 1:57 PM    [place]

## 2021-02-09 NOTE — Patient Instructions (Signed)

## 2021-02-15 DIAGNOSIS — I429 Cardiomyopathy, unspecified: Secondary | ICD-10-CM | POA: Diagnosis not present

## 2021-02-15 DIAGNOSIS — N2581 Secondary hyperparathyroidism of renal origin: Secondary | ICD-10-CM | POA: Diagnosis not present

## 2021-02-15 DIAGNOSIS — D631 Anemia in chronic kidney disease: Secondary | ICD-10-CM | POA: Diagnosis not present

## 2021-02-15 DIAGNOSIS — E1122 Type 2 diabetes mellitus with diabetic chronic kidney disease: Secondary | ICD-10-CM | POA: Diagnosis not present

## 2021-02-15 DIAGNOSIS — N189 Chronic kidney disease, unspecified: Secondary | ICD-10-CM | POA: Diagnosis not present

## 2021-02-15 DIAGNOSIS — R809 Proteinuria, unspecified: Secondary | ICD-10-CM | POA: Diagnosis not present

## 2021-02-15 DIAGNOSIS — I509 Heart failure, unspecified: Secondary | ICD-10-CM | POA: Diagnosis not present

## 2021-02-15 DIAGNOSIS — N1832 Chronic kidney disease, stage 3b: Secondary | ICD-10-CM | POA: Diagnosis not present

## 2021-02-25 ENCOUNTER — Other Ambulatory Visit: Payer: Self-pay | Admitting: Cardiology

## 2021-02-25 ENCOUNTER — Telehealth: Payer: Self-pay | Admitting: Cardiology

## 2021-02-25 MED ORDER — EMPAGLIFLOZIN 10 MG PO TABS
10.0000 mg | ORAL_TABLET | Freq: Every day | ORAL | 2 refills | Status: DC
Start: 2021-02-25 — End: 2021-03-08

## 2021-02-25 NOTE — Telephone Encounter (Signed)
Reviewed with Dr Marlou Porch who gives approval for refills.  Rx printed and signed by Dr Marlou Porch.  Will be faxed to # as requested.

## 2021-02-25 NOTE — Telephone Encounter (Signed)
Called pt to notify him Dr Marlou Porch approved refills for [facility] and that I had faxed to RX to the # he left so that everything could be expedited.  Pt reports he was not calling for a refill, he is calling about the status of his patient assistance paperwork.  He reports he left it here (at our office) in 12/2019 and again 12/2020.  Advised pt I have not received any paperwork for assistance with Jardiance and do not see anything in his chart regarding any assistance.  I do see where there was paperwork completed for assistance with Entresto.    I will forward this information to [facility] Via, LPN to see if perhaps she may be aware of it.

## 2021-02-25 NOTE — Telephone Encounter (Signed)
*  STAT* If patient is at the pharmacy, call can be transferred to refill team.   1. Which medications need to be refilled? (please list name of each medication and dose if known)  empagliflozin (JARDIANCE) 10 MG TABS tablet  2. Which pharmacy/location (including street and city if local pharmacy) is medication to be sent to? [place], [place] - [address], [address]  3. Do they need a 30 day or 90 day supply? 90   Patient provided [place] with an urgent fax number to send the RX to. The urgent fax # is [phone]. This will help the refill request be expedited. He only has 9 pills left

## 2021-02-25 NOTE — Telephone Encounter (Signed)
*  STAT* If patient is at the pharmacy, call can be transferred to refill team.   1. Which medications need to be refilled? (please list name of each medication and dose if known) empagliflozin (JARDIANCE) 10 MG TABS tablet  2. Which pharmacy/location (including street and city if local pharmacy) is medication to be sent to? [place], [address], [address]  [phone]  3. Do they need a 30 day or 90 day supply? 90  Patient states he is almost out of medication. Please advise

## 2021-02-25 NOTE — Telephone Encounter (Signed)
Called pt to inform him that he needed to contact his endocrinologist for a refill on his jardiance and pt stated that he talked to Dr. Marlou Porch nurse concerning this matter. Pt would like Dr. Marlou Porch nurse Jeannene Patella, RN, to give him a call back concerning this matter. Please address

## 2021-02-25 NOTE — Telephone Encounter (Signed)
Pt is requesting a refill on jardiance. Would Dr. Marlou Porch like to refill this medication? Please address

## 2021-02-26 NOTE — Telephone Encounter (Signed)
**Note De-Identified Angelita Harnack Obfuscation** The pt did email me his completed application but it is printing out too small to read. I emailed him back and requested that he try e-mailing to me one more time and if I cannot print that one for him to bring his entire application to Dr Marlou Porch office to drop off when he comes to pick up his Jardiance samples.

## 2021-02-26 NOTE — Telephone Encounter (Signed)
**Note De-Identified [name] Obfuscation** The pt states that he emailed his [employer] Pt Asst application to Dr Marlou Porch nurse X 2 and that his wife brought his application to the office last month and still [employer] is telling them they have not received it. He states he is down to 8 Jardiance tablets at this time. I asked him to email his application to me (I did provide him my email address) so I can get this taken care of ASAP. He states that he will email it to me today and thanked me for calling him to discuss.

## 2021-02-26 NOTE — Telephone Encounter (Addendum)
Corey Richards and I have not done anything with it. There is nothing in Dr. Kingsley Plan box. We can provide patient samples if he is running out.

## 2021-02-26 NOTE — Telephone Encounter (Signed)
**Note De-Identified Corey Richards Obfuscation** The pt emailed me a [employer] Pt Asst application for his Jardiance but sections 1, 2, 3, and 4 (the pts part of the application) was not filled in.  I called him to discuss and he states he was in a hurry trying to email it to me that he did not realize that he emailed me a blank one and that he will complete it and re email it back to me.  I recommended that he take his time completing his part of the application and email to me once it is ready and that there is no rush as we have left him 3 Jardiance 10 mg sample boxes in the front office at Dr Marlou Porch office for him to pick up which should be enough until we can get this taken care of.  He thanked me for our asst with this and for the samples we are providing him.

## 2021-03-01 NOTE — Telephone Encounter (Addendum)
**Note De-Identified Tyah Acord Obfuscation** The pt let his [employer] pt asst application at the office. I completed the provider page of the application and have emailed all to our DOD of the day, Dr Francesca Oman nurse (Dr Marlou Porch will not be back in office until next week) so she can obtain her signature, date it, and to fax to [employer] at fax number written on cover letter included or to take to Medical Records to place in nurses box to be faxed.

## 2021-03-02 NOTE — Telephone Encounter (Signed)
Pt assistance forms printed off and given to DOD Dr. Meda Coffee to sign and date.  Forms were placed in nurses fax box in medical records, to be faxed to the contact information provided on cover sheet.

## 2021-03-03 NOTE — Telephone Encounter (Signed)
Received fax that pt application is missing his proof of income

## 2021-03-04 NOTE — Telephone Encounter (Signed)
**Note De-Identified Enora Trillo Obfuscation** I have sent the pt the following email: Hi Mr Kabat, Just wanted you to know that we received a letter Harshini Trent fax from [employer] stating that your patient assistance application is missing your proof of income. Your application cannot go through their process without this information.  Please contact them to discuss at [phone]. Thank you, Jeani Hawking

## 2021-03-08 ENCOUNTER — Telehealth: Payer: Self-pay | Admitting: Cardiology

## 2021-03-08 DIAGNOSIS — E119 Type 2 diabetes mellitus without complications: Secondary | ICD-10-CM | POA: Diagnosis not present

## 2021-03-08 DIAGNOSIS — Z794 Long term (current) use of insulin: Secondary | ICD-10-CM | POA: Diagnosis not present

## 2021-03-08 DIAGNOSIS — E1165 Type 2 diabetes mellitus with hyperglycemia: Secondary | ICD-10-CM | POA: Diagnosis not present

## 2021-03-08 MED ORDER — EMPAGLIFLOZIN 10 MG PO TABS
10.0000 mg | ORAL_TABLET | Freq: Every day | ORAL | 2 refills | Status: DC
Start: 2021-03-08 — End: 2021-10-21

## 2021-03-08 NOTE — Telephone Encounter (Signed)
*  STAT* If patient is at the pharmacy, call can be transferred to refill team.   1. Which medications need to be refilled? (please list name of each medication and dose if known) Jardiace   2. Which pharmacy/location (including street and city if local pharmacy) is medication to be sent to? [phone] [facility]  3. Do they need a 30 day or 90 day supply? 90  Patient also states some one suppose to help him to get this medication he needs assistant.

## 2021-03-08 NOTE — Telephone Encounter (Signed)
**Note De-Identified Ryman Rathgeber Obfuscation** The pt wanted me to know that he was denied asst for Jardiance through [facility] because his income exceeds the program limit. He is requesting that his Jardiance refill be sent to [facility] pharmacy to fill as it is less expensive if he gets it from them.  He is aware that I have e-scribed his Jardiance RX to [facility] #90 with 2 refills. He thanked me for my help with this.

## 2021-03-08 NOTE — Telephone Encounter (Signed)
Hey Lynn, LPN, can you please advise on this matter? Thanks  ?

## 2021-03-12 ENCOUNTER — Encounter (INDEPENDENT_AMBULATORY_CARE_PROVIDER_SITE_OTHER): Payer: Medicare Other | Admitting: Ophthalmology

## 2021-03-17 ENCOUNTER — Encounter (INDEPENDENT_AMBULATORY_CARE_PROVIDER_SITE_OTHER): Payer: Medicare Other | Admitting: Ophthalmology

## 2021-03-17 ENCOUNTER — Other Ambulatory Visit: Payer: Self-pay

## 2021-03-17 DIAGNOSIS — H35033 Hypertensive retinopathy, bilateral: Secondary | ICD-10-CM | POA: Diagnosis not present

## 2021-03-17 DIAGNOSIS — H43813 Vitreous degeneration, bilateral: Secondary | ICD-10-CM | POA: Diagnosis not present

## 2021-03-17 DIAGNOSIS — I10 Essential (primary) hypertension: Secondary | ICD-10-CM

## 2021-03-17 DIAGNOSIS — H35371 Puckering of macula, right eye: Secondary | ICD-10-CM

## 2021-03-17 DIAGNOSIS — E113393 Type 2 diabetes mellitus with moderate nonproliferative diabetic retinopathy without macular edema, bilateral: Secondary | ICD-10-CM

## 2021-04-08 DIAGNOSIS — Z794 Long term (current) use of insulin: Secondary | ICD-10-CM | POA: Diagnosis not present

## 2021-04-08 DIAGNOSIS — E119 Type 2 diabetes mellitus without complications: Secondary | ICD-10-CM | POA: Diagnosis not present

## 2021-04-08 DIAGNOSIS — E1165 Type 2 diabetes mellitus with hyperglycemia: Secondary | ICD-10-CM | POA: Diagnosis not present

## 2021-04-26 ENCOUNTER — Other Ambulatory Visit: Payer: Self-pay | Admitting: Cardiology

## 2021-05-08 DIAGNOSIS — E1165 Type 2 diabetes mellitus with hyperglycemia: Secondary | ICD-10-CM | POA: Diagnosis not present

## 2021-05-08 DIAGNOSIS — Z794 Long term (current) use of insulin: Secondary | ICD-10-CM | POA: Diagnosis not present

## 2021-05-08 DIAGNOSIS — E119 Type 2 diabetes mellitus without complications: Secondary | ICD-10-CM | POA: Diagnosis not present

## 2021-05-26 DIAGNOSIS — G4733 Obstructive sleep apnea (adult) (pediatric): Secondary | ICD-10-CM | POA: Diagnosis not present

## 2021-06-04 DIAGNOSIS — E119 Type 2 diabetes mellitus without complications: Secondary | ICD-10-CM | POA: Diagnosis not present

## 2021-06-04 DIAGNOSIS — Z794 Long term (current) use of insulin: Secondary | ICD-10-CM | POA: Diagnosis not present

## 2021-06-04 DIAGNOSIS — E1165 Type 2 diabetes mellitus with hyperglycemia: Secondary | ICD-10-CM | POA: Diagnosis not present

## 2021-06-30 ENCOUNTER — Other Ambulatory Visit: Payer: Self-pay

## 2021-07-01 ENCOUNTER — Encounter: Payer: Self-pay | Admitting: Family Medicine

## 2021-07-01 ENCOUNTER — Ambulatory Visit (INDEPENDENT_AMBULATORY_CARE_PROVIDER_SITE_OTHER): Payer: Medicare Other | Admitting: Family Medicine

## 2021-07-01 VITALS — BP 138/74 | HR 97 | Temp 97.1°F | Ht 73.0 in | Wt 241.4 lb

## 2021-07-01 DIAGNOSIS — N189 Chronic kidney disease, unspecified: Secondary | ICD-10-CM | POA: Insufficient documentation

## 2021-07-01 DIAGNOSIS — G8929 Other chronic pain: Secondary | ICD-10-CM | POA: Diagnosis not present

## 2021-07-01 DIAGNOSIS — I739 Peripheral vascular disease, unspecified: Secondary | ICD-10-CM

## 2021-07-01 DIAGNOSIS — Z8673 Personal history of transient ischemic attack (TIA), and cerebral infarction without residual deficits: Secondary | ICD-10-CM | POA: Diagnosis not present

## 2021-07-01 DIAGNOSIS — N1832 Chronic kidney disease, stage 3b: Secondary | ICD-10-CM | POA: Insufficient documentation

## 2021-07-01 DIAGNOSIS — I10 Essential (primary) hypertension: Secondary | ICD-10-CM

## 2021-07-01 DIAGNOSIS — I509 Heart failure, unspecified: Secondary | ICD-10-CM | POA: Diagnosis not present

## 2021-07-01 DIAGNOSIS — I2510 Atherosclerotic heart disease of native coronary artery without angina pectoris: Secondary | ICD-10-CM | POA: Diagnosis not present

## 2021-07-01 DIAGNOSIS — L858 Other specified epidermal thickening: Secondary | ICD-10-CM

## 2021-07-01 DIAGNOSIS — E1122 Type 2 diabetes mellitus with diabetic chronic kidney disease: Secondary | ICD-10-CM

## 2021-07-01 DIAGNOSIS — E559 Vitamin D deficiency, unspecified: Secondary | ICD-10-CM | POA: Diagnosis not present

## 2021-07-01 DIAGNOSIS — Z125 Encounter for screening for malignant neoplasm of prostate: Secondary | ICD-10-CM | POA: Diagnosis not present

## 2021-07-01 DIAGNOSIS — M545 Low back pain, unspecified: Secondary | ICD-10-CM | POA: Diagnosis not present

## 2021-07-01 DIAGNOSIS — Z0000 Encounter for general adult medical examination without abnormal findings: Secondary | ICD-10-CM | POA: Insufficient documentation

## 2021-07-01 NOTE — Progress Notes (Signed)
Established Patient Office Visit  Subjective:  Patient ID: Corey Richards, male    DOB: 03/10/1946  Age: 75 y.o. MRN: [id number]  CC:  Chief Complaint  Patient presents with   Establish Care    NP/establish care, no concerns. Would like referral to dermatologist.     HPI Corey Richards presents for establishment of care.  He is accompanied by his wife today.  This has not been a good year for him.  Earlier he underwent back surgery and developed subsequent osteomyelitis.  Antibiotic treatment was associated with an acute kidney injury.  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  He has had stents placed in his lower extremity arteries.  Carries a diagnosis of CHF with an ejection fracture of 35%.  He is status post TIAs.  Continues to see nephrology for CKD.  He has a bump on his left lower lip that is been present for 3 weeks now after he cut himself shaving.  He has been left chronically weak with chronic back pain and semi ambulatory.   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    Sleep apnea    Stroke [facility])     Past Surgical History:  Procedure Laterality Date   cataract surgery     coronary artery bypass grafts N/A    peripheral angiography Bilateral     Family History  Problem Relation Age of Onset   Alzheimer's disease Father    Heart attack Father     Social History   Socioeconomic History   Marital status: Married    Spouse name: Not on file   Number of children: Not on  file   Years of education: Not on file   Highest education level: Not on file  Occupational History   Not on file  Tobacco Use   Smoking status: Former    Types: Cigars    Start date: 07/24/1969    Quit date: 07/24/1981    Years since quitting: 39.9   Smokeless tobacco: Never  Vaping Use   Vaping Use: Never used  Substance and Sexual Activity   Alcohol use: Never   Drug use: Not on file   Sexual activity: Yes  Other Topics Concern   Not on file  Social History Narrative   Not on file   Social Determinants of Health   Financial Resource Strain: Not on file  Food Insecurity: Not on file  Transportation Needs: Not on file  Physical Activity: Not on file  Stress: Not on file  Social Connections: Not on file  Intimate Partner Violence: Not on file    Outpatient Medications Prior to Visit  Medication Sig Dispense Refill   atorvastatin (LIPITOR) 40 MG tablet Take 40 mg by mouth at bedtime.     calcitRIOL (ROCALTROL) 0.25 MCG capsule Take 0.25 mcg by mouth daily.     carvedilol (COREG) 12.5 MG tablet Take  12.5 mg by mouth 2 (two) times daily with a meal.     clopidogrel (PLAVIX) 75 MG tablet Take 75 mg by mouth daily.     Dulaglutide (TRULICITY) 1.61 WR/6.0AV SOPN Inject 0.5 mLs into the skin once a week.     empagliflozin (JARDIANCE) 10 MG TABS tablet Take 1 tablet (10 mg total) by mouth daily. 90 tablet 2   furosemide (LASIX) 40 MG tablet TAKE 1 TABLET BY MOUTH  DAILY , CAN TAKE EXTRA  TABLET DAILY AS NEEDED FOR  EDEMA 135 tablet 3   primidone (MYSOLINE) 50 MG tablet Take 50 mg by mouth 2 (two) times daily after a meal.     sacubitril-valsartan (ENTRESTO) 97-103 MG Take 1 tablet by mouth 2 (two) times daily.     sodium bicarbonate 650 MG tablet Take 648 mg by mouth 4 (four) times daily.     terazosin (HYTRIN) 10 MG capsule Take 1 capsule (10 mg total) by mouth daily. Take $RemoveBef'10mg'psQYKxXWZL$  tab daily - along with $RemoveBe'5mg'yHmzeeDkz$  tab 90 capsule 2   terazosin (HYTRIN) 5 MG capsule Take 1 capsule (5 mg  total) by mouth daily. Take $RemoveBef'5mg'hZkWqOQSMD$  tab along with $RemoveBe'10mg'DzoNYAggQ$  tab 90 capsule 2   Vitamin D, Cholecalciferol, 25 MCG (1000 UT) CAPS Take 1 capsule by mouth daily.     No facility-administered medications prior to visit.    No Known Allergies  ROS Review of Systems  Constitutional:  Negative for diaphoresis, fatigue, fever and unexpected weight change.  HENT: Negative.    Eyes:  Negative for photophobia and visual disturbance.  Respiratory:  Positive for apnea. Negative for cough.   Cardiovascular:  Negative for chest pain.  Gastrointestinal: Negative.   Genitourinary:  Negative for difficulty urinating, frequency and urgency.  Musculoskeletal:  Positive for back pain, gait problem and myalgias.  Skin:  Positive for wound.  Neurological:  Positive for weakness. Negative for speech difficulty.  Psychiatric/Behavioral: Negative.       Objective:    Physical Exam Vitals and nursing note reviewed.  Constitutional:      General: He is not in acute distress.    Appearance: Normal appearance. He is obese. He is not ill-appearing, toxic-appearing or diaphoretic.  HENT:     Head: Normocephalic and atraumatic.     Right Ear: Tympanic membrane, ear canal and external ear normal.     Left Ear: Tympanic membrane, ear canal and external ear normal.     Mouth/Throat:     Mouth: Mucous membranes are dry.     Pharynx: Oropharynx is clear. No oropharyngeal exudate or posterior oropharyngeal erythema.  Eyes:     General: No scleral icterus.       Right eye: No discharge.        Left eye: No discharge.     Extraocular Movements: Extraocular movements intact.     Conjunctiva/sclera: Conjunctivae normal.     Pupils: Pupils are equal, round, and reactive to light.  Neck:     Vascular: No carotid bruit.  Cardiovascular:     Rate and Rhythm: Normal rate and regular rhythm.  Pulmonary:     Effort: Pulmonary effort is normal. No respiratory distress.     Breath sounds: Normal breath sounds. No wheezing or  rhonchi.  Abdominal:     General: Abdomen is flat. Bowel sounds are normal. There is no distension.     Palpations: Abdomen is soft. There is no mass.     Tenderness: There is no abdominal tenderness. There is no guarding  or rebound.     Hernia: A hernia is present. Hernia is present in the ventral area.  Musculoskeletal:     Cervical back: No rigidity or tenderness.  Lymphadenopathy:     Cervical: No cervical adenopathy.  Skin:    General: Skin is warm and dry.       Neurological:     Mental Status: He is alert and oriented to person, place, and time.  Psychiatric:        Mood and Affect: Mood normal.        Behavior: Behavior normal.    BP 138/74   Pulse 97   Temp (!) 97.1 F (36.2 C) (Temporal)   Ht $R'6\' 1"'EY$  (1.854 m)   Wt 241 lb 6.4 oz (109.5 kg)   SpO2 98%   BMI 31.85 kg/m  Wt Readings from Last 3 Encounters:  07/01/21 241 lb 6.4 oz (109.5 kg)  02/09/21 226 lb (102.5 kg)  11/10/20 240 lb (108.9 kg)     Health Maintenance Due  Topic Date Due   HEMOGLOBIN A1C  Never done   FOOT EXAM  Never done   OPHTHALMOLOGY EXAM  Never done   Hepatitis C Screening  Never done   TETANUS/TDAP  Never done   Zoster Vaccines- Shingrix (1 of 2) Never done   PNA vac Low Risk Adult (1 of 2 - PCV13) Never done    There are no preventive care reminders to display for this patient.  No results found for: TSH No results found for: WBC, HGB, HCT, MCV, PLT No results found for: NA, K, CHLORIDE, CO2, GLUCOSE, BUN, CREATININE, BILITOT, ALKPHOS, AST, ALT, PROT, ALBUMIN, CALCIUM, ANIONGAP, EGFR, GFR No results found for: CHOL No results found for: HDL No results found for: LDLCALC No results found for: TRIG No results found for: CHOLHDL No results found for: HGBA1C    Assessment & Plan:   Problem List Items Addressed This Visit       Cardiovascular and Mediastinum   PAD (peripheral artery disease) ([place]) - Primary   Relevant Orders   CBC   Comprehensive metabolic panel   Lipid  panel   Essential hypertension   Relevant Orders   CBC   Comprehensive metabolic panel   Urinalysis, Routine w reflex microscopic   ASCVD (arteriosclerotic cardiovascular disease)   Relevant Orders   CBC   Comprehensive metabolic panel   Lipid panel   Congestive heart failure ([place])   Relevant Orders   Lipid panel     Endocrine   Type 2 diabetes mellitus with stage 3b chronic kidney disease, without long-term current use of insulin ([place])   Relevant Orders   Comprehensive metabolic panel   Hemoglobin A1c   Urinalysis, Routine w reflex microscopic     Musculoskeletal and Integument   Keratoacanthoma   Relevant Orders   Ambulatory referral to Dermatology     Genitourinary   Chronic kidney disease   Relevant Orders   CBC   Comprehensive metabolic panel   Urinalysis, Routine w reflex microscopic     Other   Healthcare maintenance   Relevant Orders   PSA   History of TIA (transient ischemic attack)   Relevant Orders   Lipid panel   Vitamin D deficiency   Relevant Orders   VITAMIN D 25 Hydroxy (Vit-D Deficiency, Fractures)   Chronic low back pain    No orders of the defined types were placed in this encounter.   Follow-up: Return in about 3 months (around 10/01/2021).  Advise Shingrix vaccine.  Follow-up in 3 months. Libby Maw, MD

## 2021-07-02 ENCOUNTER — Other Ambulatory Visit (INDEPENDENT_AMBULATORY_CARE_PROVIDER_SITE_OTHER): Payer: Medicare Other

## 2021-07-02 ENCOUNTER — Other Ambulatory Visit: Payer: Self-pay

## 2021-07-02 ENCOUNTER — Telehealth: Payer: Self-pay | Admitting: Family Medicine

## 2021-07-02 DIAGNOSIS — Z0000 Encounter for general adult medical examination without abnormal findings: Secondary | ICD-10-CM | POA: Diagnosis not present

## 2021-07-02 LAB — COMPREHENSIVE METABOLIC PANEL
ALT: 11 U/L (ref 0–53)
AST: 11 U/L (ref 0–37)
Albumin: 4.5 g/dL (ref 3.5–5.2)
Alkaline Phosphatase: 43 U/L (ref 39–117)
BUN: 97 mg/dL (ref 6–23)
CO2: 32 mEq/L (ref 19–32)
Calcium: 10.8 mg/dL — ABNORMAL HIGH (ref 8.4–10.5)
Chloride: 95 mEq/L — ABNORMAL LOW (ref 96–112)
Creatinine, Ser: 3.01 mg/dL — ABNORMAL HIGH (ref 0.40–1.50)
GFR: 19.76 mL/min — ABNORMAL LOW (ref >60.00)
Glucose, Bld: 167 mg/dL — ABNORMAL HIGH (ref 70–99)
Potassium: 4.5 mEq/L (ref 3.5–5.1)
Sodium: 138 mEq/L (ref 135–145)
Total Bilirubin: 0.3 mg/dL (ref 0.2–1.2)
Total Protein: 6.8 g/dL (ref 6.0–8.3)

## 2021-07-02 LAB — PSA: PSA: 2.66 ng/mL (ref 0.10–4.00)

## 2021-07-02 LAB — URINALYSIS, ROUTINE W REFLEX MICROSCOPIC
Bilirubin Urine: NEGATIVE
Hgb urine dipstick: NEGATIVE
Ketones, ur: NEGATIVE
Nitrite: NEGATIVE
Specific Gravity, Urine: 1.015 (ref 1.000–1.030)
Total Protein, Urine: NEGATIVE
Urine Glucose: >=1000 — AB
Urobilinogen, UA: 0.2 (ref 0.0–1.0)
pH: 5.5 (ref 5.0–8.0)

## 2021-07-02 LAB — CBC
HCT: 39.3 % (ref 39.0–52.0)
Hemoglobin: 13.1 g/dL (ref 13.0–17.0)
MCHC: 33.3 g/dL (ref 30.0–36.0)
MCV: 93.2 fl (ref 78.0–100.0)
Platelets: 150 10*3/uL (ref 150.0–400.0)
RBC: 4.22 Mil/uL (ref 4.22–5.81)
RDW: 14 % (ref 11.5–15.5)
WBC: 4.1 10*3/uL (ref 4.0–10.5)

## 2021-07-02 LAB — LIPID PANEL
Cholesterol: 156 mg/dL (ref 0–200)
HDL: 31 mg/dL — ABNORMAL LOW (ref >39.00)
NonHDL: 124.56
Total CHOL/HDL Ratio: 5
Triglycerides: 324 mg/dL — ABNORMAL HIGH (ref 0.0–149.0)
VLDL: 64.8 mg/dL — ABNORMAL HIGH (ref 0.0–40.0)

## 2021-07-02 LAB — HEMOGLOBIN A1C: Hgb A1c MFr Bld: 7.5 % — ABNORMAL HIGH (ref 4.6–6.5)

## 2021-07-02 LAB — VITAMIN D 25 HYDROXY (VIT D DEFICIENCY, FRACTURES): VITD: 77.01 ng/mL (ref 30.00–100.00)

## 2021-07-02 LAB — LDL CHOLESTEROL, DIRECT: Direct LDL: 71 mg/dL

## 2021-07-02 NOTE — Telephone Encounter (Signed)
Critical lab resulted on pt. BUN =97, creat = 3.01. no prior labs for comparison available in epic or care everywhere. Pt with known CKD stage 3b and follows with nephrology Dr. Justin Mend. Results appear to be decreased from baseline as GFR = 19.76. pt seen yesterday by PCP to establish care. No issues noted re: decreased urine output. Tried to call pt but no answer and VM box is full so not able to leave message. No answer on wife's cell number.  Will route to PCP as FYI and PCP's MA to call pt on Monday AM and recommend call to and f/u with nephro Dr. Justin Mend.  Component     Latest Ref Rng & Units 07/02/2021  Sodium     135 - 145 mEq/L 138  Potassium     3.5 - 5.1 mEq/L 4.5  Chloride     96 - 112 mEq/L 95 (L)  CO2     19 - 32 mEq/L 32  Glucose     70 - 99 mg/dL 167 (H)  BUN     6 - 23 mg/dL 97 (HH)  Creatinine     0.40 - 1.50 mg/dL 3.01 (H)  Total Bilirubin     0.2 - 1.2 mg/dL 0.3  Alkaline Phosphatase     39 - 117 U/L 43  AST     0 - 37 U/L 11  ALT     0 - 53 U/L 11  Total Protein     6.0 - 8.3 g/dL 6.8  Albumin     3.5 - 5.2 g/dL 4.5  GFR     >60.00 mL/min 19.76 (L)  Calcium     8.4 - 10.5 mg/dL 10.8 (H)

## 2021-07-02 NOTE — Addendum Note (Signed)
Addended by: Lynnea Ferrier on: 07/02/2021 09:40 AM   Modules accepted: Orders

## 2021-07-04 DIAGNOSIS — Z794 Long term (current) use of insulin: Secondary | ICD-10-CM | POA: Diagnosis not present

## 2021-07-04 DIAGNOSIS — E1165 Type 2 diabetes mellitus with hyperglycemia: Secondary | ICD-10-CM | POA: Diagnosis not present

## 2021-07-04 DIAGNOSIS — E119 Type 2 diabetes mellitus without complications: Secondary | ICD-10-CM | POA: Diagnosis not present

## 2021-07-05 NOTE — Progress Notes (Signed)
Labs are "okay" I think. GFR or kidney function is low at 19. He is a new patient for me and I know that he is seeing nephrology for chronic kidney disease. Is this normal for him. I have no other labs for him.

## 2021-07-07 ENCOUNTER — Other Ambulatory Visit: Payer: Self-pay | Admitting: *Deleted

## 2021-07-07 NOTE — Telephone Encounter (Signed)
Pt called into the refill room requesting a hand written prescription for his Delene Loll so he can mail it to [place] to get filled.  Per pt, he can get it filled there for a 1/3 of the cost he does here.   I will route to Dr. Marlou Porch / Rexene Agent, RN to make sure this is ok.  Pt understands someone will call him when it's ready.  PEr pt, can either pick it up or we can mail it to him.

## 2021-07-07 NOTE — Telephone Encounter (Signed)
Patient called and spoken with he agrees to schedule appointment with nephrologist.

## 2021-07-08 DIAGNOSIS — E1122 Type 2 diabetes mellitus with diabetic chronic kidney disease: Secondary | ICD-10-CM | POA: Diagnosis not present

## 2021-07-08 DIAGNOSIS — R809 Proteinuria, unspecified: Secondary | ICD-10-CM | POA: Diagnosis not present

## 2021-07-08 DIAGNOSIS — I429 Cardiomyopathy, unspecified: Secondary | ICD-10-CM | POA: Diagnosis not present

## 2021-07-08 DIAGNOSIS — N1832 Chronic kidney disease, stage 3b: Secondary | ICD-10-CM | POA: Diagnosis not present

## 2021-07-08 DIAGNOSIS — I509 Heart failure, unspecified: Secondary | ICD-10-CM | POA: Diagnosis not present

## 2021-07-08 DIAGNOSIS — N2581 Secondary hyperparathyroidism of renal origin: Secondary | ICD-10-CM | POA: Diagnosis not present

## 2021-07-08 DIAGNOSIS — D631 Anemia in chronic kidney disease: Secondary | ICD-10-CM | POA: Diagnosis not present

## 2021-07-09 ENCOUNTER — Other Ambulatory Visit: Payer: Self-pay | Admitting: Nephrology

## 2021-07-09 DIAGNOSIS — N1832 Chronic kidney disease, stage 3b: Secondary | ICD-10-CM

## 2021-07-13 DIAGNOSIS — D485 Neoplasm of uncertain behavior of skin: Secondary | ICD-10-CM | POA: Diagnosis not present

## 2021-07-13 DIAGNOSIS — C4401 Basal cell carcinoma of skin of lip: Secondary | ICD-10-CM | POA: Diagnosis not present

## 2021-07-14 MED ORDER — SACUBITRIL-VALSARTAN 97-103 MG PO TABS: 1.0000 | ORAL_TABLET | Freq: Two times a day (BID) | ORAL | 3 refills | Status: DC

## 2021-07-14 NOTE — Telephone Encounter (Signed)
Left message for pt to call back as to whether he wants to p/u or have RX mailed to his home address.

## 2021-07-16 NOTE — Telephone Encounter (Signed)
Never heard back from pt.  RX mailed to pt's home address.

## 2021-07-19 ENCOUNTER — Ambulatory Visit
Admission: RE | Admit: 2021-07-19 | Discharge: 2021-07-19 | Disposition: A | Discharge status: Home / Self Care | Payer: Medicare Other | Source: Ambulatory Visit | Attending: Nephrology | Admitting: Nephrology

## 2021-07-19 DIAGNOSIS — N189 Chronic kidney disease, unspecified: Secondary | ICD-10-CM | POA: Diagnosis not present

## 2021-07-19 DIAGNOSIS — N1832 Chronic kidney disease, stage 3b: Secondary | ICD-10-CM

## 2021-07-26 ENCOUNTER — Other Ambulatory Visit: Payer: Medicare Other

## 2021-08-03 ENCOUNTER — Telehealth: Payer: Self-pay | Admitting: Family Medicine

## 2021-08-03 NOTE — Telephone Encounter (Signed)
Pt wife dropped off form for Dr Ethelene Hal to sign and asked if there was anyway she can pick it up tomorrow. Please advise. I put in Dr Bebe Shaggy folder up front

## 2021-08-04 DIAGNOSIS — E119 Type 2 diabetes mellitus without complications: Secondary | ICD-10-CM | POA: Diagnosis not present

## 2021-08-04 DIAGNOSIS — E1165 Type 2 diabetes mellitus with hyperglycemia: Secondary | ICD-10-CM | POA: Diagnosis not present

## 2021-08-04 DIAGNOSIS — Z794 Long term (current) use of insulin: Secondary | ICD-10-CM | POA: Diagnosis not present

## 2021-08-04 NOTE — Telephone Encounter (Signed)
Form signed and up front for pick up

## 2021-08-09 DIAGNOSIS — E113299 Type 2 diabetes mellitus with mild nonproliferative diabetic retinopathy without macular edema, unspecified eye: Secondary | ICD-10-CM | POA: Diagnosis not present

## 2021-08-09 DIAGNOSIS — I2510 Atherosclerotic heart disease of native coronary artery without angina pectoris: Secondary | ICD-10-CM | POA: Diagnosis not present

## 2021-08-09 DIAGNOSIS — E1151 Type 2 diabetes mellitus with diabetic peripheral angiopathy without gangrene: Secondary | ICD-10-CM | POA: Diagnosis not present

## 2021-08-09 DIAGNOSIS — Z79899 Other long term (current) drug therapy: Secondary | ICD-10-CM | POA: Diagnosis not present

## 2021-08-09 DIAGNOSIS — E1165 Type 2 diabetes mellitus with hyperglycemia: Secondary | ICD-10-CM | POA: Diagnosis not present

## 2021-08-09 DIAGNOSIS — Z7984 Long term (current) use of oral hypoglycemic drugs: Secondary | ICD-10-CM | POA: Diagnosis not present

## 2021-08-10 ENCOUNTER — Ambulatory Visit: Payer: Medicare Other | Admitting: Cardiology

## 2021-08-10 DIAGNOSIS — C4402 Squamous cell carcinoma of skin of lip: Secondary | ICD-10-CM | POA: Diagnosis not present

## 2021-08-17 DIAGNOSIS — E1122 Type 2 diabetes mellitus with diabetic chronic kidney disease: Secondary | ICD-10-CM | POA: Diagnosis not present

## 2021-08-17 DIAGNOSIS — I509 Heart failure, unspecified: Secondary | ICD-10-CM | POA: Diagnosis not present

## 2021-08-17 DIAGNOSIS — I429 Cardiomyopathy, unspecified: Secondary | ICD-10-CM | POA: Diagnosis not present

## 2021-08-17 DIAGNOSIS — D631 Anemia in chronic kidney disease: Secondary | ICD-10-CM | POA: Diagnosis not present

## 2021-08-17 DIAGNOSIS — N2581 Secondary hyperparathyroidism of renal origin: Secondary | ICD-10-CM | POA: Diagnosis not present

## 2021-08-17 DIAGNOSIS — R809 Proteinuria, unspecified: Secondary | ICD-10-CM | POA: Diagnosis not present

## 2021-08-17 DIAGNOSIS — N1832 Chronic kidney disease, stage 3b: Secondary | ICD-10-CM | POA: Diagnosis not present

## 2021-08-18 ENCOUNTER — Encounter (INDEPENDENT_AMBULATORY_CARE_PROVIDER_SITE_OTHER): Payer: Medicare Other | Admitting: Ophthalmology

## 2021-08-18 ENCOUNTER — Other Ambulatory Visit: Payer: Self-pay

## 2021-08-18 DIAGNOSIS — H43813 Vitreous degeneration, bilateral: Secondary | ICD-10-CM

## 2021-08-18 DIAGNOSIS — E113393 Type 2 diabetes mellitus with moderate nonproliferative diabetic retinopathy without macular edema, bilateral: Secondary | ICD-10-CM | POA: Diagnosis not present

## 2021-08-18 DIAGNOSIS — H35371 Puckering of macula, right eye: Secondary | ICD-10-CM | POA: Diagnosis not present

## 2021-08-18 DIAGNOSIS — H35033 Hypertensive retinopathy, bilateral: Secondary | ICD-10-CM

## 2021-08-18 DIAGNOSIS — I10 Essential (primary) hypertension: Secondary | ICD-10-CM

## 2021-08-25 DIAGNOSIS — N1832 Chronic kidney disease, stage 3b: Secondary | ICD-10-CM | POA: Diagnosis not present

## 2021-09-08 DIAGNOSIS — E119 Type 2 diabetes mellitus without complications: Secondary | ICD-10-CM | POA: Diagnosis not present

## 2021-09-08 DIAGNOSIS — E1165 Type 2 diabetes mellitus with hyperglycemia: Secondary | ICD-10-CM | POA: Diagnosis not present

## 2021-09-08 DIAGNOSIS — Z794 Long term (current) use of insulin: Secondary | ICD-10-CM | POA: Diagnosis not present

## 2021-09-15 DIAGNOSIS — R809 Proteinuria, unspecified: Secondary | ICD-10-CM | POA: Diagnosis not present

## 2021-09-15 DIAGNOSIS — N2581 Secondary hyperparathyroidism of renal origin: Secondary | ICD-10-CM | POA: Diagnosis not present

## 2021-09-15 DIAGNOSIS — D631 Anemia in chronic kidney disease: Secondary | ICD-10-CM | POA: Diagnosis not present

## 2021-09-15 DIAGNOSIS — I509 Heart failure, unspecified: Secondary | ICD-10-CM | POA: Diagnosis not present

## 2021-09-15 DIAGNOSIS — E1122 Type 2 diabetes mellitus with diabetic chronic kidney disease: Secondary | ICD-10-CM | POA: Diagnosis not present

## 2021-09-15 DIAGNOSIS — N1832 Chronic kidney disease, stage 3b: Secondary | ICD-10-CM | POA: Diagnosis not present

## 2021-09-15 DIAGNOSIS — I429 Cardiomyopathy, unspecified: Secondary | ICD-10-CM | POA: Diagnosis not present

## 2021-09-29 DIAGNOSIS — N1832 Chronic kidney disease, stage 3b: Secondary | ICD-10-CM | POA: Diagnosis not present

## 2021-10-01 ENCOUNTER — Ambulatory Visit: Payer: Medicare Other | Admitting: Family Medicine

## 2021-10-08 DIAGNOSIS — Z794 Long term (current) use of insulin: Secondary | ICD-10-CM | POA: Diagnosis not present

## 2021-10-08 DIAGNOSIS — E119 Type 2 diabetes mellitus without complications: Secondary | ICD-10-CM | POA: Diagnosis not present

## 2021-10-08 DIAGNOSIS — E1165 Type 2 diabetes mellitus with hyperglycemia: Secondary | ICD-10-CM | POA: Diagnosis not present

## 2021-10-14 ENCOUNTER — Other Ambulatory Visit: Payer: Self-pay

## 2021-10-14 MED ORDER — CARVEDILOL 12.5 MG PO TABS
12.5000 mg | ORAL_TABLET | Freq: Two times a day (BID) | ORAL | 1 refills | Status: DC
Start: 2021-10-14 — End: 2021-10-14

## 2021-10-14 MED ORDER — CARVEDILOL 12.5 MG PO TABS
12.5000 mg | ORAL_TABLET | Freq: Two times a day (BID) | ORAL | 1 refills | Status: DC
Start: 2021-10-14 — End: 2022-02-24

## 2021-10-21 ENCOUNTER — Other Ambulatory Visit: Payer: Self-pay | Admitting: Cardiology

## 2021-10-21 MED ORDER — EMPAGLIFLOZIN 10 MG PO TABS
10.0000 mg | ORAL_TABLET | Freq: Every day | ORAL | 1 refills | Status: DC
Start: 2021-10-21 — End: 2022-02-22

## 2021-10-26 ENCOUNTER — Telehealth: Payer: Self-pay | Admitting: Family Medicine

## 2021-10-26 NOTE — Telephone Encounter (Signed)
Left message for patient to call back and schedule Medicare Annual Wellness Visit (AWV) in office.  ° °If not able to come in office, please offer to do virtually or by telephone.  Left office number and my jabber #[phone]. ° °Due for AWVI ° °Please schedule at anytime with Nurse Health Advisor. °  °

## 2021-11-05 ENCOUNTER — Other Ambulatory Visit: Payer: Self-pay | Admitting: Cardiology

## 2021-11-08 DIAGNOSIS — Z794 Long term (current) use of insulin: Secondary | ICD-10-CM | POA: Diagnosis not present

## 2021-11-08 DIAGNOSIS — E1165 Type 2 diabetes mellitus with hyperglycemia: Secondary | ICD-10-CM | POA: Diagnosis not present

## 2021-11-08 DIAGNOSIS — E119 Type 2 diabetes mellitus without complications: Secondary | ICD-10-CM | POA: Diagnosis not present

## 2021-11-15 DIAGNOSIS — Z794 Long term (current) use of insulin: Secondary | ICD-10-CM | POA: Diagnosis not present

## 2021-11-15 DIAGNOSIS — Z7984 Long term (current) use of oral hypoglycemic drugs: Secondary | ICD-10-CM | POA: Diagnosis not present

## 2021-11-15 DIAGNOSIS — E1151 Type 2 diabetes mellitus with diabetic peripheral angiopathy without gangrene: Secondary | ICD-10-CM | POA: Diagnosis not present

## 2021-11-15 DIAGNOSIS — E113299 Type 2 diabetes mellitus with mild nonproliferative diabetic retinopathy without macular edema, unspecified eye: Secondary | ICD-10-CM | POA: Diagnosis not present

## 2021-11-18 DIAGNOSIS — D631 Anemia in chronic kidney disease: Secondary | ICD-10-CM | POA: Diagnosis not present

## 2021-11-18 DIAGNOSIS — N1832 Chronic kidney disease, stage 3b: Secondary | ICD-10-CM | POA: Diagnosis not present

## 2021-11-18 DIAGNOSIS — E1122 Type 2 diabetes mellitus with diabetic chronic kidney disease: Secondary | ICD-10-CM | POA: Diagnosis not present

## 2021-11-18 DIAGNOSIS — I429 Cardiomyopathy, unspecified: Secondary | ICD-10-CM | POA: Diagnosis not present

## 2021-11-18 DIAGNOSIS — R809 Proteinuria, unspecified: Secondary | ICD-10-CM | POA: Diagnosis not present

## 2021-11-18 DIAGNOSIS — N2581 Secondary hyperparathyroidism of renal origin: Secondary | ICD-10-CM | POA: Diagnosis not present

## 2021-11-18 DIAGNOSIS — I509 Heart failure, unspecified: Secondary | ICD-10-CM | POA: Diagnosis not present

## 2021-11-25 DIAGNOSIS — L570 Actinic keratosis: Secondary | ICD-10-CM | POA: Diagnosis not present

## 2021-11-25 DIAGNOSIS — L720 Epidermal cyst: Secondary | ICD-10-CM | POA: Diagnosis not present

## 2021-11-25 DIAGNOSIS — D485 Neoplasm of uncertain behavior of skin: Secondary | ICD-10-CM | POA: Diagnosis not present

## 2021-11-25 DIAGNOSIS — L821 Other seborrheic keratosis: Secondary | ICD-10-CM | POA: Diagnosis not present

## 2021-11-25 DIAGNOSIS — D225 Melanocytic nevi of trunk: Secondary | ICD-10-CM | POA: Diagnosis not present

## 2021-11-25 DIAGNOSIS — D692 Other nonthrombocytopenic purpura: Secondary | ICD-10-CM | POA: Diagnosis not present

## 2021-11-25 DIAGNOSIS — Z85828 Personal history of other malignant neoplasm of skin: Secondary | ICD-10-CM | POA: Diagnosis not present

## 2021-12-02 ENCOUNTER — Other Ambulatory Visit: Payer: Self-pay

## 2021-12-02 DIAGNOSIS — L988 Other specified disorders of the skin and subcutaneous tissue: Secondary | ICD-10-CM | POA: Diagnosis not present

## 2021-12-02 DIAGNOSIS — D485 Neoplasm of uncertain behavior of skin: Secondary | ICD-10-CM | POA: Diagnosis not present

## 2021-12-02 MED ORDER — CLOPIDOGREL BISULFATE 75 MG PO TABS
75.0000 mg | ORAL_TABLET | Freq: Every day | ORAL | 0 refills | Status: DC
Start: 2021-12-02 — End: 2022-02-02

## 2021-12-14 ENCOUNTER — Ambulatory Visit (INDEPENDENT_AMBULATORY_CARE_PROVIDER_SITE_OTHER): Payer: Medicare Other

## 2021-12-14 DIAGNOSIS — Z0000 Encounter for general adult medical examination without abnormal findings: Secondary | ICD-10-CM

## 2021-12-14 NOTE — Patient Instructions (Signed)
Corey Richards , Thank you for taking time to come for your Medicare Wellness Visit. I appreciate your ongoing commitment to your health goals. Please review the following plan we discussed and let me know if I can assist you in the future.   Screening recommendations/referrals: Colonoscopy: 04/26/2015 Recommended yearly ophthalmology/optometry visit for glaucoma screening and checkup Recommended yearly dental visit for hygiene and checkup  Vaccinations: Influenza vaccine: completed  Pneumococcal vaccine: due  Tdap vaccine: due  Shingles vaccine: unknown     Advanced directives: yes   Conditions/risks identified: none   Next appointment: none   Preventive Care 35 Years and Older, Male Preventive care refers to lifestyle choices and visits with your health care provider that can promote health and wellness. What does preventive care include? A yearly physical exam. This is also called an annual well check. Dental exams once or twice a year. Routine eye exams. Ask your health care provider how often you should have your eyes checked. Personal lifestyle choices, including: Daily care of your teeth and gums. Regular physical activity. Eating a healthy diet. Avoiding tobacco and drug use. Limiting alcohol use. Practicing safe sex. Taking low doses of aspirin every day. Taking vitamin and mineral supplements as recommended by your health care provider. What happens during an annual well check? The services and screenings done by your health care provider during your annual well check will depend on your age, overall health, lifestyle risk factors, and family history of disease. Counseling  Your health care provider may ask you questions about your: Alcohol use. Tobacco use. Drug use. Emotional well-being. Home and relationship well-being. Sexual activity. Eating habits. History of falls. Memory and ability to understand (cognition). Work and work Statistician. Screening  You  may have the following tests or measurements: Height, weight, and BMI. Blood pressure. Lipid and cholesterol levels. These may be checked every 5 years, or more frequently if you are over 70 years old. Skin check. Lung cancer screening. You may have this screening every year starting at age 25 if you have a 30-pack-year history of smoking and currently smoke or have quit within the past 15 years. Fecal occult blood test (FOBT) of the stool. You may have this test every year starting at age 31. Flexible sigmoidoscopy or colonoscopy. You may have a sigmoidoscopy every 5 years or a colonoscopy every 10 years starting at age 82. Prostate cancer screening. Recommendations will vary depending on your family history and other risks. Hepatitis C blood test. Hepatitis B blood test. Sexually transmitted disease (STD) testing. Diabetes screening. This is done by checking your blood sugar (glucose) after you have not eaten for a while (fasting). You may have this done every 1-3 years. Abdominal aortic aneurysm (AAA) screening. You may need this if you are a current or former smoker. Osteoporosis. You may be screened starting at age 20 if you are at high risk. Talk with your health care provider about your test results, treatment options, and if necessary, the need for more tests. Vaccines  Your health care provider may recommend certain vaccines, such as: Influenza vaccine. This is recommended every year. Tetanus, diphtheria, and acellular pertussis (Tdap, Td) vaccine. You may need a Td booster every 10 years. Zoster vaccine. You may need this after age 88. Pneumococcal 13-valent conjugate (PCV13) vaccine. One dose is recommended after age 48. Pneumococcal polysaccharide (PPSV23) vaccine. One dose is recommended after age 5. Talk to your health care provider about which screenings and vaccines you need and how often you  need them. This information is not intended to replace advice given to you by your  health care provider. Make sure you discuss any questions you have with your health care provider. Document Released: 12/25/2015 Document Revised: 08/17/2016 Document Reviewed: 09/29/2015 Elsevier Interactive Patient Education  2017 [employer] Prevention in the Home Falls can cause injuries. They can happen to people of all ages. There are many things you can do to make your home safe and to help prevent falls. What can I do on the outside of my home? Regularly fix the edges of walkways and driveways and fix any cracks. Remove anything that might make you trip as you walk through a door, such as a raised step or threshold. Trim any bushes or trees on the path to your home. Use bright outdoor lighting. Clear any walking paths of anything that might make someone trip, such as rocks or tools. Regularly check to see if handrails are loose or broken. Make sure that both sides of any steps have handrails. Any raised decks and porches should have guardrails on the edges. Have any leaves, snow, or ice cleared regularly. Use sand or salt on walking paths during winter. Clean up any spills in your garage right away. This includes oil or grease spills. What can I do in the bathroom? Use night lights. Install grab bars by the toilet and in the tub and shower. Do not use towel bars as grab bars. Use non-skid mats or decals in the tub or shower. If you need to sit down in the shower, use a plastic, non-slip stool. Keep the floor dry. Clean up any water that spills on the floor as soon as it happens. Remove soap buildup in the tub or shower regularly. Attach bath mats securely with double-sided non-slip rug tape. Do not have throw rugs and other things on the floor that can make you trip. What can I do in the bedroom? Use night lights. Make sure that you have a light by your bed that is easy to reach. Do not use any sheets or blankets that are too big for your bed. They should not hang down  onto the floor. Have a firm chair that has side arms. You can use this for support while you get dressed. Do not have throw rugs and other things on the floor that can make you trip. What can I do in the kitchen? Clean up any spills right away. Avoid walking on wet floors. Keep items that you use a lot in easy-to-reach places. If you need to reach something above you, use a strong step stool that has a grab bar. Keep electrical cords out of the way. Do not use floor polish or wax that makes floors slippery. If you must use wax, use non-skid floor wax. Do not have throw rugs and other things on the floor that can make you trip. What can I do with my stairs? Do not leave any items on the stairs. Make sure that there are handrails on both sides of the stairs and use them. Fix handrails that are broken or loose. Make sure that handrails are as long as the stairways. Check any carpeting to make sure that it is firmly attached to the stairs. Fix any carpet that is loose or worn. Avoid having throw rugs at the top or bottom of the stairs. If you do have throw rugs, attach them to the floor with carpet tape. Make sure that you have a light switch  at the top of the stairs and the bottom of the stairs. If you do not have them, ask someone to add them for you. What else can I do to help prevent falls? Wear shoes that: Do not have high heels. Have rubber bottoms. Are comfortable and fit you well. Are closed at the toe. Do not wear sandals. If you use a stepladder: Make sure that it is fully opened. Do not climb a closed stepladder. Make sure that both sides of the stepladder are locked into place. Ask someone to hold it for you, if possible. Clearly mark and make sure that you can see: Any grab bars or handrails. First and last steps. Where the edge of each step is. Use tools that help you move around (mobility aids) if they are needed. These include: Canes. Walkers. Scooters. Crutches. Turn  on the lights when you go into a dark area. Replace any light bulbs as soon as they burn out. Set up your furniture so you have a clear path. Avoid moving your furniture around. If any of your floors are uneven, fix them. If there are any pets around you, be aware of where they are. Review your medicines with your doctor. Some medicines can make you feel dizzy. This can increase your chance of falling. Ask your doctor what other things that you can do to help prevent falls. This information is not intended to replace advice given to you by your health care provider. Make sure you discuss any questions you have with your health care provider. Document Released: 09/24/2009 Document Revised: 05/05/2016 Document Reviewed: 01/02/2015 Elsevier Interactive Patient Education  2017 [employer].

## 2021-12-14 NOTE — Progress Notes (Signed)
Subjective:   Anh Mangano is a 76 y.o. male who presents for an Initial Medicare Annual Wellness Visit.  I connected with Aydan Levitz today by telephone and verified that I am speaking with the correct person using two identifiers. Location patient: home Location provider: work Persons participating in the virtual visit: patient, provider.   I discussed the limitations, risks, security and privacy concerns of performing an evaluation and management service by telephone and the availability of in person appointments. I also discussed with the patient that there may be a patient responsible charge related to this service. The patient expressed understanding and verbally consented to this telephonic visit.    Interactive audio and video telecommunications were attempted between this provider and patient, however failed, due to patient having technical difficulties OR patient did not have access to video capability.  We continued and completed visit with audio only.    Review of Systems     Cardiac Risk Factors include: advanced age (>2men, >15 women);diabetes mellitus;male gender;dyslipidemia;hypertension     Objective:    Today's Vitals   There is no height or weight on file to calculate BMI.  Advanced Directives 12/14/2021  Does Patient Have a Medical Advance Directive? Yes  Type of Paramedic of [place];Living will  Copy of [place] in Chart? No - copy requested    Current Medications (verified) Outpatient Encounter Medications as of 12/14/2021  Medication Sig   atorvastatin (LIPITOR) 40 MG tablet Take 40 mg by mouth at bedtime.   carvedilol (COREG) 12.5 MG tablet Take 1 tablet (12.5 mg total) by mouth 2 (two) times daily with a meal.   clopidogrel (PLAVIX) 75 MG tablet Take 1 tablet (75 mg total) by mouth daily. Please keep upcoming appt. in Jan. in order to receive further refills. Thank You.   empagliflozin (JARDIANCE) 10 MG  TABS tablet Take 1 tablet (10 mg total) by mouth daily.   furosemide (LASIX) 40 MG tablet TAKE 1 TABLET BY MOUTH  DAILY , CAN TAKE EXTRA  TABLET DAILY AS NEEDED FOR  EDEMA   primidone (MYSOLINE) 50 MG tablet Take 50 mg by mouth 2 (two) times daily after a meal.   sodium bicarbonate 650 MG tablet Take 648 mg by mouth 4 (four) times daily.   terazosin (HYTRIN) 10 MG capsule TAKE 1 CAPSULE BY MOUTH  DAILY ALONG WITH 5 MG  CAPSULE   terazosin (HYTRIN) 5 MG capsule TAKE 1 CAPSULE BY MOUTH  DAILY ALONG WITH 10 MG  CAPSULE   Vitamin D, Cholecalciferol, 25 MCG (1000 UT) CAPS Take 1 capsule by mouth daily.   calcitRIOL (ROCALTROL) 0.25 MCG capsule Take 0.25 mcg by mouth daily.   Dulaglutide (TRULICITY) 5.73 UK/0.2RK SOPN Inject 0.5 mLs into the skin once a week.   sacubitril-valsartan (ENTRESTO) 97-103 MG Take 1 tablet by mouth 2 (two) times daily.   No facility-administered encounter medications on file as of 12/14/2021.    Allergies (verified) Patient has no known allergies.   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    Sleep apnea    Stroke [facility])    Past Surgical History:  Procedure Laterality Date   cataract surgery     coronary artery bypass grafts N/A    peripheral angiography Bilateral    Family History  Problem Relation Age of Onset   Alzheimer's disease Father    Heart attack Father    Social History   Socioeconomic History   Marital status: Married    Spouse name: Not on file   Number of children: Not  on file   Years of education: Not on file   Highest education level: Not on file  Occupational History   Not on file  Tobacco Use   Smoking status: Former    Types: Cigars    Start date: 07/24/1969    Quit date: 07/24/1981    Years since quitting: 40.4   Smokeless tobacco: Never  Vaping Use   Vaping Use: Never used  Substance and Sexual Activity   Alcohol use: Never   Drug use: Not on file   Sexual activity: Yes  Other Topics Concern   Not on file  Social History Narrative   Not on file   Social Determinants of Health   Financial Resource Strain: Low Risk    Difficulty of Paying Living Expenses: Not hard at all  Food Insecurity: No Food Insecurity   Worried About Charity fundraiser in the Last Year: Never true   [place] in the Last Year: Never true  Transportation Needs: No Transportation Needs   Lack of Transportation (Medical): No   Lack of Transportation (Non-Medical): No  Physical Activity: Inactive   Days of Exercise per Week: 0 days   Minutes of Exercise per Session: 0 min  Stress: No Stress Concern Present   Feeling of Stress : Not at all  Social Connections: Moderately Integrated   Frequency of Communication with Friends and Family: Twice a week   Frequency of Social Gatherings with Friends and Family: Twice a week   Attends Religious Services: 1 to 4 times per year   Active Member of [employer] or Organizations: No   Attends Music therapist: Never   Marital Status: Married    Tobacco Counseling Counseling given: Not Answered   Clinical Intake:  Pre-visit preparation completed: Yes  Pain : No/denies pain     Nutritional Risks: None Diabetes: Yes CBG done?: No Did pt. bring in CBG monitor from home?: No  How often do you need to have someone help you when you read instructions, pamphlets, or other written materials from your doctor or pharmacy?: 1 - Never What is the last grade level you completed in school?:  college  Diabetic?yes  Nutrition Risk Assessment:  Has the patient had any N/V/D within the last 2 months?  No  Does the patient have any non-healing wounds?  No  Has the patient had any unintentional weight loss or weight gain?  No   Diabetes:  Is the patient diabetic?  Yes  If diabetic, was a CBG obtained today?  No  Did the patient bring in their glucometer from home?  No  How often do you monitor your CBG's? dailyn.   Financial Strains and Diabetes Management:  Are you having any financial strains with the device, your supplies or your medication? No .  Does the patient want to be seen  by Chronic Care Management for management of their diabetes?  No  Would the patient like to be referred to a Nutritionist or for Diabetic Management?  No   Diabetic Exams:  Diabetic Eye Exam: Completed 05/2021 Diabetic Foot Exam: Overdue, Pt has been advised about the importance in completing this exam. Pt is scheduled for diabetic foot exam on next office visit .   Interpreter Needed?: No  Information entered by :: [place] of Daily Living In your present state of health, do you have any difficulty performing the following activities: 12/14/2021 12/13/2021  Hearing? N N  Vision? N N  Difficulty concentrating or making decisions? N N  Walking or climbing stairs? N Y  Dressing or bathing? N N  Doing errands, shopping? N N  Preparing Food and eating ? N N  Using the Toilet? N N  In the past six months, have you accidently leaked urine? N N  Do you have problems with loss of bowel control? N N  Managing your Medications? N N  Managing your Finances? N N  Housekeeping or managing your Housekeeping? N N  Some recent data might be hidden    Patient Care Team: Libby Maw, MD as PCP - General (Family Medicine) Jerline Pain, MD as PCP - Cardiology (Cardiology)  Indicate any recent Medical Services you may have received from other than [facility] providers in the  past year (date may be approximate).     Assessment:   This is a routine wellness examination for Adalberto.  Hearing/Vision screen Vision Screening - Comments:: Annual eye exams wear glasses   Dietary issues and exercise activities discussed: Current Exercise Habits: The patient does not participate in regular exercise at present, Exercise limited by: None identified   Goals Addressed   None    Depression Screen PHQ 2/9 Scores 12/14/2021 12/14/2021 12/14/2021 07/01/2021 07/01/2021  PHQ - 2 Score 0 0 0 0 0  PHQ- 9 Score - - - 0 -    Fall Risk Fall Risk  12/14/2021 12/13/2021 07/01/2021  Falls in the past year? 0 0 0  Number falls in past yr: 0 - -  Injury with Fall? 0 0 -  Comment cane /life alert - -  Follow up Falls evaluation completed - -    FALL RISK PREVENTION PERTAINING TO THE HOME:  Any stairs in or around the home? Yes  If so, are there any without handrails? No  Home free of loose throw rugs in walkways, pet beds, electrical cords, etc? Yes  Adequate lighting in your home to reduce risk of falls? Yes   ASSISTIVE DEVICES UTILIZED TO PREVENT FALLS:  Life alert? Yes  Use of a cane, walker or w/c? Yes  Grab bars in the bathroom? Yes  Shower chair or bench in shower? Yes  Elevated toilet seat or a handicapped toilet? Yes    Cognitive Function:  Normal cognitive status assessed by direct observation by this Nurse Health Advisor. No abnormalities found.        Immunizations Immunization History  Administered Date(s) Administered   Influenza-Unspecified 08/28/2020   PFIZER(Purple Top)SARS-COV-2 Vaccination 01/14/2020, 02/05/2020, 09/01/2020   Zoster, Unspecified 11/26/2019, 02/03/2020    TDAP status: Due, Education has been provided regarding the importance of this vaccine. Advised may receive this vaccine at local pharmacy or Health Dept. Aware to provide a copy of the vaccination record if obtained from local pharmacy or Health Dept. Verbalized acceptance and  understanding.  Flu Vaccine status: Up to  date  Pneumococcal vaccine status: Due, Education has been provided regarding the importance of this vaccine. Advised may receive this vaccine at local pharmacy or Health Dept. Aware to provide a copy of the vaccination record if obtained from local pharmacy or Health Dept. Verbalized acceptance and understanding.  Covid-19 vaccine status: Completed vaccines  Qualifies for Shingles Vaccine? Yes   Zostavax completed No   Shingrix Completed?: No.    Education has been provided regarding the importance of this vaccine. Patient has been advised to call insurance company to determine out of pocket expense if they have not yet received this vaccine. Advised may also receive vaccine at local pharmacy or Health Dept. Verbalized acceptance and understanding.  Screening Tests Health Maintenance  Topic Date Due   Pneumonia Vaccine 55+ Years old (1 - PCV) Never done   FOOT EXAM  Never done   OPHTHALMOLOGY EXAM  Never done   Hepatitis C Screening  Never done   TETANUS/TDAP  Never done   Zoster Vaccines- Shingrix (1 of 2) 01/07/1966   INFLUENZA VACCINE  07/12/2021   HEMOGLOBIN A1C  01/02/2022   COLONOSCOPY (Pts 45-13yrs Insurance coverage will need to be confirmed)  04/25/2025   HPV VACCINES  Aged Out   COVID-19 Vaccine  Discontinued    Health Maintenance  Health Maintenance Due  Topic Date Due   Pneumonia Vaccine 23+ Years old (1 - PCV) Never done   FOOT EXAM  Never done   OPHTHALMOLOGY EXAM  Never done   Hepatitis C Screening  Never done   TETANUS/TDAP  Never done   Zoster Vaccines- Shingrix (1 of 2) 01/07/1966   INFLUENZA VACCINE  07/12/2021    Colorectal cancer screening: Type of screening: Colonoscopy. Completed 04/26/2015. Repeat every 10 years  Lung Cancer Screening: (Low Dose CT Chest recommended if Age 40-80 years, 30 pack-year currently smoking OR have quit w/in 15years.) does not qualify.   Lung Cancer Screening Referral:  n/a  Additional Screening:  Hepatitis C Screening: does qualify;   Vision Screening: Recommended annual ophthalmology exams for early detection of glaucoma and other disorders of the eye. Is the patient up to date with their annual eye exam?  Yes  Who is the provider or what is the name of the office in which the patient attends annual eye exams? [email or web address]  If pt is not established with a provider, would they like to be referred to a provider to establish care? No .   Dental Screening: Recommended annual dental exams for proper oral hygiene  Community Resource Referral / Chronic Care Management: CRR required this visit?  No   CCM required this visit?  No      Plan:     I have personally reviewed and noted the following in the patients chart:   Medical and social history Use of alcohol, tobacco or illicit drugs  Current medications and supplements including opioid prescriptions. Patient is not currently taking opioid prescriptions. Functional ability and status Nutritional status Physical activity Advanced directives List of other physicians Hospitalizations, surgeries, and ER visits in previous 12 months Vitals Screenings to include cognitive, depression, and falls Referrals and appointments  In addition, I have reviewed and discussed with patient certain preventive protocols, quality metrics, and best practice recommendations. A written personalized care plan for preventive services as well as general preventive health recommendations were provided to patient.     Randel Pigg, LPN   08/13/6383   Nurse Notes: none

## 2021-12-21 DIAGNOSIS — E1165 Type 2 diabetes mellitus with hyperglycemia: Secondary | ICD-10-CM | POA: Diagnosis not present

## 2021-12-21 DIAGNOSIS — E119 Type 2 diabetes mellitus without complications: Secondary | ICD-10-CM | POA: Diagnosis not present

## 2021-12-21 DIAGNOSIS — Z794 Long term (current) use of insulin: Secondary | ICD-10-CM | POA: Diagnosis not present

## 2021-12-22 ENCOUNTER — Encounter: Payer: Self-pay | Admitting: Cardiology

## 2021-12-22 ENCOUNTER — Other Ambulatory Visit: Payer: Self-pay

## 2021-12-22 ENCOUNTER — Ambulatory Visit: Payer: Medicare Other | Admitting: Cardiology

## 2021-12-22 DIAGNOSIS — E7800 Pure hypercholesterolemia, unspecified: Secondary | ICD-10-CM | POA: Insufficient documentation

## 2021-12-22 DIAGNOSIS — Z8673 Personal history of transient ischemic attack (TIA), and cerebral infarction without residual deficits: Secondary | ICD-10-CM | POA: Diagnosis not present

## 2021-12-22 DIAGNOSIS — I5022 Chronic systolic (congestive) heart failure: Secondary | ICD-10-CM | POA: Insufficient documentation

## 2021-12-22 DIAGNOSIS — I2510 Atherosclerotic heart disease of native coronary artery without angina pectoris: Secondary | ICD-10-CM

## 2021-12-22 NOTE — Patient Instructions (Signed)

## 2021-12-22 NOTE — Progress Notes (Signed)
Cardiology Office Note:    Date:  12/22/2021   ID:  Corey Richards, DOB November 14, 1946, MRN [id number]  PCP:  Libby Maw, MD   [place]  Cardiologist:  Candee Furbish, MD  Advanced Practice Provider:  No care team member to display Electrophysiologist:  None       Referring MD: Seward Carol, MD     History of Present Illness:    Corey Richards is a 76 y.o. male here for CAD follow-up post CABG November 2018 and perioperative paroxysmal atrial fibrillation.  Amiodarone finally stopped after 30-day event monitor showed sinus rhythm.  He had a stroke in 2016 prior to CABG.  Today, he reports he is doing well.  His nephrologist stopped his dosage of entresto 3 months ago in order to balance his fluid levels. He was also told to stop 5.64 mg trulicity. Still on 10 mg jardiance and 12.5 mg carvedilol. He will be seeing his nephrologist next month.  He reports no problems with breathing. Does not walk around often due to his cane.  LDL-49 in May 2022. He is managing his cholesterol levels with 40 mg Lipitor and reports no problems.  He denies chest pain, shortness of breath, palpitations, lightheadedness, headaches, syncope, LE edema, orthopnea, PND.   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   carvedilol (COREG) 12.5 MG tablet Take 1 tablet (12.5 mg total) by mouth 2 (two) times daily with a meal.   clopidogrel (PLAVIX) 75 MG tablet Take 1 tablet (75 mg total) by mouth daily. Please keep upcoming appt. in Jan. in order to receive further refills. Thank You.   Dulaglutide (TRULICITY) 3.32 RJ/1.8AC SOPN Inject 0.5 mLs into the skin once a week.   empagliflozin (JARDIANCE) 10 MG TABS tablet Take 1 tablet (10 mg total) by mouth daily.   furosemide (LASIX) 40 MG tablet TAKE 1 TABLET BY MOUTH  DAILY , CAN TAKE EXTRA  TABLET DAILY AS NEEDED FOR  EDEMA   primidone (MYSOLINE) 50 MG tablet Take 50 mg by mouth 2 (two) times daily after a meal.   sodium bicarbonate 650 MG tablet Take 648 mg by mouth 4 (four) times daily.   terazosin (HYTRIN) 10 MG capsule TAKE 1 CAPSULE BY MOUTH  DAILY ALONG WITH 5 MG  CAPSULE   terazosin (HYTRIN) 5 MG capsule TAKE 1 CAPSULE BY MOUTH  DAILY ALONG WITH 10 MG  CAPSULE   Vitamin D, Cholecalciferol, 25 MCG (1000 UT) CAPS Take 1 capsule by mouth daily.     Allergies:  Patient has no known allergies.   Social History   Socioeconomic History   Marital status: Married    Spouse name: Not on file   Number of children: Not on file   Years of education: Not on file   Highest education level: Not on file  Occupational History   Not on file  Tobacco Use   Smoking status: Former    Types: Cigars    Start date: 07/24/1969    Quit date: 07/24/1981    Years since quitting: 40.4   Smokeless tobacco: Never  Vaping Use   Vaping Use: Never used  Substance and Sexual Activity   Alcohol use: Never   Drug use: Not on file   Sexual activity: Yes  Other Topics Concern   Not on file  Social History Narrative   Not on file   Social Determinants of Health   Financial Resource Strain: Low Risk    Difficulty of Paying Living Expenses: Not  hard at all  Food Insecurity: No Food Insecurity   Worried About Charity fundraiser in the Last Year: Never true   [place] in the Last Year: Never true  Transportation Needs: No Transportation Needs   Lack of Transportation (Medical): No   Lack of Transportation (Non-Medical): No  Physical Activity: Inactive   Days of Exercise per Week: 0 days   Minutes of Exercise per Session: 0 min  Stress: No Stress Concern Present   Feeling of Stress : Not at all  Social Connections: Moderately Integrated   Frequency of Communication with Friends and Family: Twice a week   Frequency of Social Gatherings with Friends and Family: Twice a week   Attends Religious Services: 1 to 4 times per year   Active Member of [employer] or Organizations: No   Attends Archivist Meetings: Never   Marital Status: Married     Family History: The patient's family history includes Alzheimer's disease in his father; Heart attack in his father.  ROS:   Please see the history of present illness.     All other systems reviewed and are negative.  EKGs/Labs/Other Studies Reviewed:    The following studies were reviewed today:  EKG: 12/22/2021: sinus rhythm with non-specific intra ventricular conduction delay rate- 71 bpm  ECHO 2021:  1. Technically challenging study. There is global hypokinesis, but there also appears to be focal areas of more severe hypokinesis as well. However, not all walls able to be well visualized. Recommend future  studies be done with echo contrast.. Left ventricular ejection fraction, by estimation, is 35 to 40%. The left  ventricle has moderately decreased function. The left ventricle demonstrates regional wall motion abnormalities (see scoring diagram/findings for description). The left ventricular internal cavity size was moderately to severely dilated. Left ventricular diastolic parameters are indeterminate.   2. Right ventricular systolic function was not well visualized.  The right ventricular size is not well visualized. Tricuspid regurgitation signal is inadequate for assessing PA pressure.   3. Left atrial size was moderately dilated.   4. Right atrial size was moderately dilated.   5. The mitral valve is grossly normal. Trivial mitral valve  regurgitation. No evidence of mitral stenosis.   6. The aortic valve is tricuspid. There is moderate calcification of the aortic valve. There is mild thickening of the aortic valve. Aortic valve regurgitation is not visualized. Mild to moderate aortic valve sclerosis/calcification is present, without any evidence of aortic stenosis.   Vascular ABIs:10/2020 Right: Resting  right ankle-brachial index is within normal range. ABIs are  unreliable.  Although ankle brachial indices are within normal limits (0.95-1.29),  arterial Doppler waveforms at the ankle suggest some component of arterial  occlusive disease.  Left: Resting left ankle-brachial index indicates noncompressible left  lower extremity arteries. Arterial Doppler waveforms at the ankle suggest  some component of arterial occlusive disease.    Recent Labs: 07/02/2021: ALT 11; BUN 97; Creatinine, Ser 3.01; Hemoglobin 13.1; Platelets 150.0; Potassium 4.5; Sodium 138  Recent Lipid Panel    Component Value Date/Time   CHOL 156 07/02/2021 0940   TRIG 324.0 (H) 07/02/2021 0940   HDL 31.00 (L) 07/02/2021 0940   CHOLHDL 5 07/02/2021 0940   VLDL 64.8 (H) 07/02/2021 0940   LDLDIRECT 71.0 07/02/2021 0940     Risk Assessment/Calculations:      Physical Exam:    VS:  BP 140/70 (BP Location: Left Arm, Patient Position: Sitting, Cuff Size: Large)    Pulse 71    Ht 6\' 1"  (1.854 m)    Wt 96 lb (43.5 kg)    SpO2 94%    BMI 12.67 kg/m     Wt Readings from Last 3 Encounters:  12/22/21 96 lb (43.5 kg)  07/01/21 241 lb 6.4 oz (109.5 kg)  02/09/21 226 lb (102.5 kg)     GEN:  Well nourished, well developed in no acute distress, ambulates with a cane HEENT:  Normal NECK: No JVD; No carotid bruits LYMPHATICS: No lymphadenopathy CARDIAC: RRR, no murmurs, rubs, gallops RESPIRATORY:  Clear to auscultation without rales, wheezing or rhonchi  ABDOMEN: Soft, non-tender, non-distended MUSCULOSKELETAL:  No edema; No deformity  SKIN: Warm and dry NEUROLOGIC:  Alert and oriented x 3 PSYCHIATRIC:  Normal affect   ASSESSMENT:    1. ASCVD (arteriosclerotic cardiovascular disease)   2. History of TIA (transient ischemic attack)   3. Chronic systolic heart failure ([place])   4. Pure hypercholesterolemia     PLAN:    In order of problems listed above:  ASCVD (arteriosclerotic cardiovascular disease) Bypass surgery November 2018.  Continue with goal-directed medical therapy.  On atorvastatin 40 mg high intensity.  Carvedilol 12.5 mg twice a day  History of TIA (transient ischemic attack) 2016 prior to CABG, stroke.  Chronic systolic heart failure (HCC) Continue with carvedilol.  Delene Loll is currently on hold given his creatinine.  It was up to 3.  Dr. Justin Mend with nephrology has been following and stop the [facility].  Thank you.  He is going to be seeing him next month.  If he can restart it, that would be great.  NYHA class I.  Pure hypercholesterolemia Continue with high intensity statin atorvastatin 40 mg once a day.  LDL goal less than 70, approaching 55 with prior CABG.   Follow-up in 6 months  Medication Adjustments/Labs and Tests Ordered: Current medicines are reviewed at length with the patient today.  Concerns regarding medicines are outlined above.  Orders Placed This Encounter  Procedures   EKG 12-Lead   No orders of the defined types were placed in this encounter.   Patient Instructions  Medication Instructions:  The current medical regimen is effective;  continue present plan and medications.  *If you need a refill on your cardiac medications before your next appointment, please call your pharmacy*  Follow-Up: At [facility],  you and your health needs are our priority.  As part of our continuing mission to provide you with exceptional heart care, we have created designated Provider Care Teams.  These Care Teams include your primary Cardiologist (physician) and Advanced Practice Providers (APPs -  Physician Assistants and Nurse Practitioners) who all work together to provide you with the care you need, when you need it.  We recommend signing up for the patient portal called "MyChart".  Sign up information is provided on this After Visit Summary.  MyChart is used to connect with patients for Virtual Visits (Telemedicine).  Patients are able to view lab/test results, encounter notes, upcoming appointments, etc.  Non-urgent messages can be sent to your provider as well.   To learn more about what you can do with MyChart, go to [email or web address].    Your next appointment:   6 month(s)  The format for your next appointment:   In Person  Provider:   Candee Furbish, MD     Thank you for choosing [facility]!!       I,Zite Okoli,acting as a scribe for Candee Furbish, MD.,have documented all relevant documentation on the behalf of Candee Furbish, MD,as directed by  Candee Furbish, MD while in the presence of Candee Furbish, MD.   I, Candee Furbish, MD, have reviewed all documentation for this visit. The documentation on 12/22/21 for the exam, diagnosis, procedures, and orders are all accurate and complete.   Signed, Candee Furbish, MD  12/22/2021 4:57 PM    [place]

## 2021-12-22 NOTE — Assessment & Plan Note (Signed)
2016 prior to CABG, stroke.

## 2021-12-22 NOTE — Assessment & Plan Note (Signed)
Bypass surgery November 2018.  Continue with goal-directed medical therapy.  On atorvastatin 40 mg high intensity.  Carvedilol 12.5 mg twice a day

## 2021-12-22 NOTE — Assessment & Plan Note (Addendum)
Continue with carvedilol.  Corey Richards is currently on hold given his creatinine.  It was up to 3.  Dr. Justin Mend with nephrology has been following and stop the [facility].  Thank you.  He is going to be seeing him next month.  If he can restart it, that would be great.  NYHA class I.

## 2021-12-22 NOTE — Assessment & Plan Note (Signed)
Continue with high intensity statin atorvastatin 40 mg once a day.  LDL goal less than 70, approaching 55 with prior CABG.

## 2022-01-17 DIAGNOSIS — N2581 Secondary hyperparathyroidism of renal origin: Secondary | ICD-10-CM | POA: Diagnosis not present

## 2022-01-17 DIAGNOSIS — D631 Anemia in chronic kidney disease: Secondary | ICD-10-CM | POA: Diagnosis not present

## 2022-01-17 DIAGNOSIS — I509 Heart failure, unspecified: Secondary | ICD-10-CM | POA: Diagnosis not present

## 2022-01-17 DIAGNOSIS — R809 Proteinuria, unspecified: Secondary | ICD-10-CM | POA: Diagnosis not present

## 2022-01-17 DIAGNOSIS — N1832 Chronic kidney disease, stage 3b: Secondary | ICD-10-CM | POA: Diagnosis not present

## 2022-01-17 DIAGNOSIS — I429 Cardiomyopathy, unspecified: Secondary | ICD-10-CM | POA: Diagnosis not present

## 2022-01-17 DIAGNOSIS — E1122 Type 2 diabetes mellitus with diabetic chronic kidney disease: Secondary | ICD-10-CM | POA: Diagnosis not present

## 2022-01-20 DIAGNOSIS — E1165 Type 2 diabetes mellitus with hyperglycemia: Secondary | ICD-10-CM | POA: Diagnosis not present

## 2022-01-20 DIAGNOSIS — E119 Type 2 diabetes mellitus without complications: Secondary | ICD-10-CM | POA: Diagnosis not present

## 2022-01-20 DIAGNOSIS — Z794 Long term (current) use of insulin: Secondary | ICD-10-CM | POA: Diagnosis not present

## 2022-02-01 ENCOUNTER — Other Ambulatory Visit: Payer: Self-pay | Admitting: *Deleted

## 2022-02-01 MED ORDER — ATORVASTATIN CALCIUM 40 MG PO TABS: 40.0000 mg | ORAL_TABLET | Freq: Every day | ORAL | 3 refills | Status: AC

## 2022-02-02 ENCOUNTER — Other Ambulatory Visit: Payer: Self-pay | Admitting: Cardiology

## 2022-02-08 ENCOUNTER — Encounter (HOSPITAL_BASED_OUTPATIENT_CLINIC_OR_DEPARTMENT_OTHER): Payer: Self-pay

## 2022-02-08 ENCOUNTER — Emergency Department (HOSPITAL_BASED_OUTPATIENT_CLINIC_OR_DEPARTMENT_OTHER)
Admission: EM | Admit: 2022-02-08 | Discharge: 2022-02-08 | Disposition: A | Discharge status: Home / Self Care | Payer: Medicare Other | Attending: Emergency Medicine | Admitting: Emergency Medicine

## 2022-02-08 ENCOUNTER — Other Ambulatory Visit: Payer: Self-pay

## 2022-02-08 DIAGNOSIS — Z7902 Long term (current) use of antithrombotics/antiplatelets: Secondary | ICD-10-CM | POA: Insufficient documentation

## 2022-02-08 DIAGNOSIS — S90411A Abrasion, right great toe, initial encounter: Secondary | ICD-10-CM | POA: Diagnosis not present

## 2022-02-08 DIAGNOSIS — S99921A Unspecified injury of right foot, initial encounter: Secondary | ICD-10-CM | POA: Diagnosis present

## 2022-02-08 DIAGNOSIS — X58XXXA Exposure to other specified factors, initial encounter: Secondary | ICD-10-CM | POA: Diagnosis not present

## 2022-02-08 DIAGNOSIS — S90414A Abrasion, right lesser toe(s), initial encounter: Secondary | ICD-10-CM

## 2022-02-08 NOTE — ED Provider Notes (Signed)
[facility] [place] EMERGENCY DEPARTMENT Provider Note   CSN: [id number] Arrival date & time: 02/08/22  0818     History  Chief Complaint  Patient presents with   Foot Injury    Corey Richards is a 76 y.o. male.  Patient arrives with bleeding from his right big toe.  He is on Plavix.  He has been dealing with a wound to the left side of his right big toe.  Started to have some bleeding last night that family is having a hard time to control.  He denies any trauma.  He has been dealing with this for over a week.  His toenail also has started to become loose.  He has follow-up with Dr. Treat next week for this.  The history is provided by the patient.      Home Medications Prior to Admission medications   Medication Sig Start Date End Date Taking? Authorizing Provider  atorvastatin (LIPITOR) 40 MG tablet Take 1 tablet (40 mg total) by mouth at bedtime. 02/01/22   Jerline Pain, MD  carvedilol (COREG) 12.5 MG tablet Take 1 tablet (12.5 mg total) by mouth 2 (two) times daily with a meal. 10/14/21   Jerline Pain, MD  clopidogrel (PLAVIX) 75 MG tablet TAKE 1 TABLET BY MOUTH DAILY 02/02/22   Jerline Pain, MD  Dulaglutide (TRULICITY) 8.33 AS/5.0NL SOPN Inject 0.5 mLs into the skin once a week. 02/08/21   [provider]  empagliflozin (JARDIANCE) 10 MG TABS tablet Take 1 tablet (10 mg total) by mouth daily. 10/21/21   Jerline Pain, MD  furosemide (LASIX) 40 MG tablet TAKE 1 TABLET BY MOUTH  DAILY , CAN TAKE EXTRA  TABLET DAILY AS NEEDED FOR  EDEMA 04/27/21   Jerline Pain, MD  primidone (MYSOLINE) 50 MG tablet Take 50 mg by mouth 2 (two) times daily after a meal.    [provider]  sodium bicarbonate 650 MG tablet Take 648 mg by mouth 4 (four) times daily.    [provider]  terazosin (HYTRIN) 10 MG capsule TAKE 1 CAPSULE BY MOUTH  DAILY ALONG WITH 5 MG  CAPSULE 11/09/21   Jerline Pain, MD  terazosin (HYTRIN) 5 MG capsule TAKE 1 CAPSULE BY MOUTH  DAILY  ALONG WITH 10 MG  CAPSULE 11/09/21   Jerline Pain, MD  Vitamin D, Cholecalciferol, 25 MCG (1000 UT) CAPS Take 1 capsule by mouth daily.    [provider]      Allergies    Patient has no known allergies.    Review of Systems   Review of Systems  Physical Exam Updated Vital Signs BP 140/80 (BP Location: Right Arm)    Pulse 90    Temp 98.2 F (36.8 C) (Oral)    Resp 18    Ht 6\' 1"  (1.854 m)    Wt 108.9 kg    SpO2 94%    BMI 31.66 kg/m  Physical Exam Constitutional:      General: He is not in acute distress.    Appearance: He is not ill-appearing.  Cardiovascular:     Pulses: Normal pulses.  Musculoskeletal:        General: No swelling or tenderness. Normal range of motion.  Skin:    General: Skin is warm.     Capillary Refill: Capillary refill takes less than 2 seconds.     Findings: No erythema.     Comments: There is a pinpoint area of bleeding from the left  portion of the left big toe that is nonarterial appearing, right big toenail is for the most part intact but without any signs of obvious infection or deformity throughout the right foot  Neurological:     General: No focal deficit present.     Mental Status: He is alert.    ED Results / Procedures / Treatments   Labs (all labs ordered are listed, but only abnormal results are displayed) Labs Reviewed - No data to display  EKG None  Radiology No results found.  Procedures Procedures    Medications Ordered in ED Medications - No data to display  ED Course/ Medical Decision Making/ A&P                           Medical Decision Making  Corey Richards is here with bleeding from his right big toe.  No trauma history.  Intermittent bleeding for the last week.  On Plavix.  Patient and family have been using direct pressure with some improvement at times.  However bleeding this morning has been more uncontrolled.  He has an area of pinpoint bleeding from the left lateral portion of the skin of the  right big toe.  Multiple modalities of holding pressure were unsuccessful and ultimately ended up using a wound seal to help create an artificial scab.  This stopped the bleeding.  Toe was wrapped.  His right big toenail is slightly loose but there is no obvious deformity or infection.  Denies any trauma.  He has follow-up with podiatry next week.  We will give him additional information to see if he can get in with a different podiatrist sooner.  Placed in a postop shoe.  Wound care instructions given.  Discharged in good condition.  This chart was dictated using voice recognition software.  Despite best efforts to proofread,  errors can occur which can change the documentation meaning.         Final Clinical Impression(s) / ED Diagnoses Final diagnoses:  Toe abrasion, right, initial encounter    Rx / DC Orders ED Discharge Orders     None         Lennice Sites, DO 02/08/22 0911

## 2022-02-08 NOTE — ED Triage Notes (Signed)
Right foot started to bleed this am and wife states it is shooting bleeding, unable to get bleeding to stop. Pt is a diabetic.

## 2022-02-08 NOTE — ED Notes (Signed)
Provider placed wound seal and controlled bleeding, wound bandaged and wrapped.

## 2022-02-08 NOTE — Discharge Instructions (Signed)
Leave wrap on your foot for the next 48 hours.  Then remove and continue to wrap with gauze.  Follow-up with podiatry.  Do not get this area directly wet for the next several days.

## 2022-02-09 ENCOUNTER — Other Ambulatory Visit: Payer: Self-pay

## 2022-02-09 DIAGNOSIS — I739 Peripheral vascular disease, unspecified: Secondary | ICD-10-CM

## 2022-02-09 DIAGNOSIS — G459 Transient cerebral ischemic attack, unspecified: Secondary | ICD-10-CM | POA: Insufficient documentation

## 2022-02-10 ENCOUNTER — Encounter: Payer: Self-pay | Admitting: Podiatry

## 2022-02-10 ENCOUNTER — Ambulatory Visit (INDEPENDENT_AMBULATORY_CARE_PROVIDER_SITE_OTHER): Payer: Medicare Other

## 2022-02-10 ENCOUNTER — Other Ambulatory Visit: Payer: Self-pay

## 2022-02-10 ENCOUNTER — Ambulatory Visit (INDEPENDENT_AMBULATORY_CARE_PROVIDER_SITE_OTHER): Payer: Medicare Other | Admitting: Podiatry

## 2022-02-10 DIAGNOSIS — M7989 Other specified soft tissue disorders: Secondary | ICD-10-CM | POA: Diagnosis not present

## 2022-02-10 DIAGNOSIS — E1122 Type 2 diabetes mellitus with diabetic chronic kidney disease: Secondary | ICD-10-CM

## 2022-02-10 DIAGNOSIS — L97512 Non-pressure chronic ulcer of other part of right foot with fat layer exposed: Secondary | ICD-10-CM | POA: Diagnosis not present

## 2022-02-10 DIAGNOSIS — N1832 Chronic kidney disease, stage 3b: Secondary | ICD-10-CM

## 2022-02-10 DIAGNOSIS — I739 Peripheral vascular disease, unspecified: Secondary | ICD-10-CM

## 2022-02-10 MED ORDER — DOXYCYCLINE MONOHYDRATE 100 MG PO CAPS: 100.0000 mg | ORAL_CAPSULE | Freq: Two times a day (BID) | ORAL | 0 refills | Status: AC

## 2022-02-10 NOTE — Progress Notes (Signed)
?  Subjective:  ?Patient ID: Corey Richards, male    DOB: 06-Feb-1946,   MRN: [id number] ? ?Chief Complaint  ?Patient presents with  ? Nail Problem  ?   ? R great toe bleeding- has been going on for about 1 week  ? ? ?76 y.o. male presents for concern of right great toe bleeding. Here today with wife who relates they noticed it about a week ago. They don't recall any injury but states they were unable to get the bleeding to stop. They were seen in urgent care and wound was dressed and given a surgical shoe. Relates it is not too painful. 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 Denies any other pedal complaints. Denies n/v/f/c.  ? ?Past Medical History:  ?Diagnosis Date  ? Abnormal liver enzymes   ? Acute renal failure (ARF) (HCC)   ? Anemia   ? Benign prostatic hyperplasia   ? CHF (congestive heart failure) ([place])   ? Coronary arteriosclerosis   ? Diabetes mellitus without complication ([place])   ? Hyperlipidemia   ? Hypertension   ? Pain in joint of left hip   ? Pain in joint of right hip   ? Paroxysmal atrial fibrillation (HCC)   ? Peripheral edema   ? Peripheral vascular disease ([place])   ? 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 Sleep apnea   ? Stroke [facility])   ? ? ?Objective:  ?Physical Exam: ?Vascular: DP/PT pulses 2/4 bilateral. CFT <3 seconds. Normal hair growth on digits. No edema.  ?Skin. No lacerations or abrasions bilateral feet. Right hallux dorsal medial wound with noted losening of the hallux nail and serous drainage noted underneath. Hallux nail removed and small laceration noted to central nail bed with granular base. Does not probe deep.  ?Musculoskeletal: MMT 5/5 bilateral lower extremities in DF, PF, Inversion and Eversion. Deceased ROM in DF of ankle joint.  ?Neurological: Sensation intact to light touch.  ? ?Assessment:  ? ?1. Skin ulcer of right great toe with fat layer exposed ([place])   ?2. Type 2 diabetes mellitus with stage 3b chronic  kidney disease, without long-term current use of insulin ([place])   ?3. PAD (peripheral artery disease) ([place])   ? ? ? ?Plan:  ?Patient was evaluated and treated and all questions answered. ?Ulcer right hallux with fat layer exposed  ?-Debridement as below. ?-Dressed with silvadene, DSD. ?-Off-loading with surgical shoe. ?-Doxycycline sent to pharmacy.  ?-X-rays ordered.  ?-Discussed glucose control and proper protein-rich diet.  ?-Discussed if any worsening redness, pain, fever or chills to call or may need to report to the emergency room. Patient expressed understanding.  ? ?Procedure: Excisional Debridement of Wound ?Rationale: Removal of non-viable soft tissue from the wound to promote healing. Hallux nail removed as well as not fully attached   ?Anesthesia: none ?Pre-Debridement Wound Measurements: Overlying nail plate and coagulated hemorrhagic tissue  ?Post-Debridement Wound Measurements: 0.5  cm x 0.3 cm x 0.2 cm  ?Type of Debridement: Sharp Excisional ?Tissue Removed: Non-viable soft tissue ?Depth of Debridement: subcutaneous tissue. ?Technique: Sharp excisional debridement to bleeding, viable wound base.  ?Dressing: Dry, sterile, compression dressing. ?Disposition: Patient tolerated procedure well. Patient to return in 1 week for follow-up. ? ?Return in about 1 week (around 02/17/2022) for wound check. ? ? ?Lorenda Peck, DPM  ? ? ? ?

## 2022-02-14 ENCOUNTER — Telehealth: Payer: Self-pay | Admitting: *Deleted

## 2022-02-14 DIAGNOSIS — Z89412 Acquired absence of left great toe: Secondary | ICD-10-CM | POA: Diagnosis not present

## 2022-02-14 DIAGNOSIS — E1151 Type 2 diabetes mellitus with diabetic peripheral angiopathy without gangrene: Secondary | ICD-10-CM | POA: Diagnosis not present

## 2022-02-14 DIAGNOSIS — Z951 Presence of aortocoronary bypass graft: Secondary | ICD-10-CM | POA: Diagnosis not present

## 2022-02-14 DIAGNOSIS — Z794 Long term (current) use of insulin: Secondary | ICD-10-CM | POA: Diagnosis not present

## 2022-02-14 DIAGNOSIS — Z89422 Acquired absence of other left toe(s): Secondary | ICD-10-CM | POA: Diagnosis not present

## 2022-02-14 DIAGNOSIS — Z7984 Long term (current) use of oral hypoglycemic drugs: Secondary | ICD-10-CM | POA: Diagnosis not present

## 2022-02-14 DIAGNOSIS — E113299 Type 2 diabetes mellitus with mild nonproliferative diabetic retinopathy without macular edema, unspecified eye: Secondary | ICD-10-CM | POA: Diagnosis not present

## 2022-02-14 NOTE — Telephone Encounter (Signed)
Patient's wife is calling for confirmation that she is not to disturb the bandaging on right toe, has an upcoming appointment on [place]. Everything seems to be ok but just wanted to make sure. ?Explained that if everything is looking ok to leave as is as instructed by the physician. She verbalized understanding. ?

## 2022-02-17 ENCOUNTER — Ambulatory Visit: Payer: Medicare Other | Admitting: Podiatry

## 2022-02-17 ENCOUNTER — Encounter: Payer: Self-pay | Admitting: Podiatry

## 2022-02-17 ENCOUNTER — Other Ambulatory Visit: Payer: Self-pay

## 2022-02-17 DIAGNOSIS — L97512 Non-pressure chronic ulcer of other part of right foot with fat layer exposed: Secondary | ICD-10-CM | POA: Diagnosis not present

## 2022-02-17 DIAGNOSIS — N1832 Chronic kidney disease, stage 3b: Secondary | ICD-10-CM

## 2022-02-17 DIAGNOSIS — E1122 Type 2 diabetes mellitus with diabetic chronic kidney disease: Secondary | ICD-10-CM

## 2022-02-17 DIAGNOSIS — L97511 Non-pressure chronic ulcer of other part of right foot limited to breakdown of skin: Secondary | ICD-10-CM

## 2022-02-17 MED ORDER — DOXYCYCLINE HYCLATE 100 MG PO TABS: 100.0000 mg | ORAL_TABLET | Freq: Two times a day (BID) | ORAL | 0 refills | Status: DC

## 2022-02-17 NOTE — Progress Notes (Signed)
?  Subjective:  ?Patient ID: Corey Richards, male    DOB: [birth year],   MRN: [id number] ? ?Chief Complaint  ?Patient presents with  ? Wound Check  ?  Right foot wound check   ? ? ?76 y.o. male presents for follow-up of right foot wound and nail bed laceration. Relates he has been doing well. Denies pain. Have kept bandage intact with no issue. . Denies any other pedal complaints. Denies n/v/f/c.  ? ?Past Medical History:  ?Diagnosis Date  ? Abnormal liver enzymes   ? Acute renal failure (ARF) (HCC)   ? Anemia   ? Benign prostatic hyperplasia   ? CHF (congestive heart failure) ([place])   ? Coronary arteriosclerosis   ? Diabetes mellitus without complication ([place])   ? Hyperlipidemia   ? Hypertension   ? Pain in joint of left hip   ? Pain in joint of right hip   ? Paroxysmal atrial fibrillation (HCC)   ? Peripheral edema   ? Peripheral vascular disease ([place])   ? 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 Sleep apnea   ? Stroke [facility])   ? ? ?Objective:  ?Physical Exam: ?Vascular: DP/PT pulses 2/4 bilateral. CFT <3 seconds. Normal hair growth on digits. No edema.  ?Skin. No lacerations or abrasions bilateral feet. Right hallux dorsal medial wound over right nail bed measures about 0.3 cm x 0.2 cm x 0.1 . Mild erythema noted to proximal nail bed. No purulence noted. Granular base. No probe to bone.  ?Musculoskeletal: MMT 5/5 bilateral lower extremities in DF, PF, Inversion and Eversion. Deceased ROM in DF of ankle joint.  ?Neurological: Sensation intact to light touch.  ? ?Assessment:  ? ?1. Skin ulcer of right great toe with fat layer exposed ([place])   ?2. Type 2 diabetes mellitus with stage 3b chronic kidney disease, without long-term current use of insulin ([place])   ? ? ? ? ?Plan:  ?Patient was evaluated and treated and all questions answered. ?Ulcer right hallux limited to breakdown of skin with possible open fracture of distal phalanx displaced plantar.  ?-Debridement as below. ?-Dressed with silvadene,  DSD. ?-Off-loading with surgical shoe. ?-Doxycycline refill sent to pharmacy.  ?-X-rays reviewed and shoe possible erosion of the distal hallux phalanx vs possible displaced fracture of the digit.  ?-MRI ordered to further evaluate.  ?-Discussed glucose control and proper protein-rich diet.  ?-Discussed if any worsening redness, pain, fever or chills to call or may need to report to the emergency room. Patient expressed understanding.  ? ?Procedure: Excisional Debridement of Wound ?Rationale: Removal of non-viable soft tissue from the wound to promote healing. Hallux nail removed as well as not fully attached   ?Anesthesia: none ?Pre-Debridement Wound Measurements:  0.3 x 0.2 x0.1 ?Post-Debridement Wound Measurements: 0.3  cm x 0.3 cm x 0.1 cm  ?Type of Debridement: Sharp Excisional ?Tissue Removed: Non-viable soft tissue ?Depth of Debridement: subcutaneous tissue. ?Technique: Sharp excisional debridement to bleeding, viable wound base.  ?Dressing: Dry, sterile, compression dressing. ?Disposition: Patient tolerated procedure well. Patient to return in 1 week for follow-up. ? ?Return in about 2 weeks (around 03/03/2022) for wound check. ? ? ?Lorenda Peck, DPM  ? ? ? ?

## 2022-02-18 ENCOUNTER — Telehealth: Payer: Self-pay | Admitting: *Deleted

## 2022-02-18 NOTE — Telephone Encounter (Signed)
Patient has been pre [phone],contact: Anessa R) for MRI right foot w/o contrast. ?[facility] has been notified to proceed with scheduling(Rachel). ?

## 2022-02-19 ENCOUNTER — Other Ambulatory Visit: Payer: Self-pay

## 2022-02-19 ENCOUNTER — Ambulatory Visit (INDEPENDENT_AMBULATORY_CARE_PROVIDER_SITE_OTHER): Payer: Medicare Other

## 2022-02-19 DIAGNOSIS — Z794 Long term (current) use of insulin: Secondary | ICD-10-CM | POA: Diagnosis not present

## 2022-02-19 DIAGNOSIS — E1165 Type 2 diabetes mellitus with hyperglycemia: Secondary | ICD-10-CM | POA: Diagnosis not present

## 2022-02-19 DIAGNOSIS — N1832 Chronic kidney disease, stage 3b: Secondary | ICD-10-CM

## 2022-02-19 DIAGNOSIS — E11621 Type 2 diabetes mellitus with foot ulcer: Secondary | ICD-10-CM

## 2022-02-19 DIAGNOSIS — L97511 Non-pressure chronic ulcer of other part of right foot limited to breakdown of skin: Secondary | ICD-10-CM | POA: Diagnosis not present

## 2022-02-19 DIAGNOSIS — E1122 Type 2 diabetes mellitus with diabetic chronic kidney disease: Secondary | ICD-10-CM

## 2022-02-19 DIAGNOSIS — E119 Type 2 diabetes mellitus without complications: Secondary | ICD-10-CM | POA: Diagnosis not present

## 2022-02-19 DIAGNOSIS — R600 Localized edema: Secondary | ICD-10-CM | POA: Diagnosis not present

## 2022-02-21 ENCOUNTER — Encounter (INDEPENDENT_AMBULATORY_CARE_PROVIDER_SITE_OTHER): Payer: Medicare Other | Admitting: Ophthalmology

## 2022-02-21 ENCOUNTER — Encounter (INDEPENDENT_AMBULATORY_CARE_PROVIDER_SITE_OTHER): Payer: Self-pay

## 2022-02-21 NOTE — H&P (View-Only) (Signed)
? ? ?ASSESSMENT & PLAN:  ?76 y.o. male with currently asymptomatic peripheral arterial disease s/p endovascular intervention in [place]. He now has ulceration about his right great toe. ? ?patients with chronic limb threatening ischemia have an annual risk of cardiovascular mortality of 25% and a high risk of amputation.  ? ?Recommend the following which can slow the progression of atherosclerosis and reduce the risk of major adverse cardiac / limb events:  ?Complete cessation from all tobacco products. ?Blood glucose control with goal A1c < 7%. ?Blood pressure control with goal blood pressure < 140/90 mmHg. ?Lipid reduction therapy with goal LDL-C <100 mg/dL (<70 if symptomatic from PAD).  ?Aspirin $RemoveB'81mg'JhHTkPTu$  PO QD.  ?Atorvastatin 40-$RemoveBeforeDEI'80mg'AAmMkDaFmPIOCWKM$  PO QD (or other "high intensity" statin therapy). ? ?Plan right lower extremity angiogram with possible intervention via left common femoral approach in cath lab 02/25/22.  ? ? ?CHIEF COMPLAINT:   ?Establish vascular care ? ?HISTORY:  ?HISTORY OF PRESENT ILLNESS: ?Corey Richards is a 76 y.o. male with history of DM, HF, HLA, HTN, AF, CAD s/p CABG. he has a strong history of peripheral arterial disease.  He is undergone multiple endovascular interventions in [place] near [place] with Dr Jaclyn Shaggy.  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  His right fifth toe is also surgically absent.  He is ambulatory.  He is a bit unsteady and ambulates with a cane.  He denies location, ischemic rest pain, ischemic ulceration. ? ?02/22/22: Patient returns to clinic. He has an ulcer about his right great toe. He reports some pain about the toe ulcer. No antecedent claudication. No ischemic rest pain.  ? ?Past Medical History:  ?Diagnosis Date  ? Abnormal liver enzymes   ? Acute renal failure (ARF) (HCC)   ? Anemia   ? Benign prostatic hyperplasia   ? CHF (congestive heart failure) ([place])   ? Coronary arteriosclerosis   ? Diabetes mellitus without complication  ([place])   ? Hyperlipidemia   ? Hypertension   ? Pain in joint of left hip   ? Pain in joint of right hip   ? Paroxysmal atrial fibrillation (HCC)   ? Peripheral edema   ? Peripheral vascular disease ([place])   ? 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 Sleep apnea   ? Stroke [facility])   ? ? ?Past Surgical History:  ?Procedure Laterality Date  ? cataract surgery    ? coronary artery bypass grafts N/A   ? peripheral angiography Bilateral   ? ? ?Family History  ?Problem Relation Age of Onset  ? Alzheimer's disease Father   ? Heart attack Father   ? ? ?Social History  ? ?Socioeconomic History  ? Marital status: Married  ?  Spouse name: Not on file  ? Number of children: Not on file  ? Years of education: Not on file  ? Highest education level: Not on file  ?Occupational History  ? Not on file  ?Tobacco Use  ? Smoking status: Former  ?  Types: Cigars  ?  Start date: 07/24/1969  ?  Quit date: 07/24/1981  ?  Years since quitting: 40.6  ? Smokeless tobacco: Never  ?Vaping Use  ? Vaping Use: Never used  ?Substance and Sexual Activity  ? Alcohol use: Never  ? Drug use: Not on file  ? Sexual activity: Yes  ?Other Topics Concern  ? Not on file  ?Social History Narrative  ? Not on file  ? ?Social Determinants  of Health  ? ?Financial Resource Strain: Low Risk   ? Difficulty of Paying Living Expenses: Not hard at all  ?Food Insecurity: No Food Insecurity  ? Worried About Charity fundraiser in the Last Year: Never true  ? Ran Out of Food in the Last Year: Never true  ?Transportation Needs: No Transportation Needs  ? Lack of Transportation (Medical): No  ? Lack of Transportation (Non-Medical): No  ?Physical Activity: Inactive  ? Days of Exercise per Week: 0 days  ? Minutes of Exercise per Session: 0 min  ?Stress: No Stress Concern Present  ? Feeling of Stress : Not at all  ?Social Connections: Moderately Integrated  ? Frequency of Communication with Friends and Family: Twice a week  ? Frequency of Social Gatherings with  Friends and Family: Twice a week  ? Attends Religious Services: 1 to 4 times per year  ? Active Member of Clubs or Organizations: No  ? Attends Archivist Meetings: Never  ? Marital Status: Married  ?Intimate Partner Violence: Not At Risk  ? Fear of Current or Ex-Partner: No  ? Emotionally Abused: No  ? Physically Abused: No  ? Sexually Abused: No  ? ? ?No Known Allergies ? ?Current Outpatient Medications  ?Medication Sig Dispense Refill  ? atorvastatin (LIPITOR) 40 MG tablet Take 1 tablet (40 mg total) by mouth at bedtime. 90 tablet 3  ? carvedilol (COREG) 12.5 MG tablet Take 1 tablet (12.5 mg total) by mouth 2 (two) times daily with a meal. 180 tablet 1  ? clopidogrel (PLAVIX) 75 MG tablet TAKE 1 TABLET BY MOUTH DAILY 90 tablet 1  ? doxycycline (VIBRA-TABS) 100 MG tablet Take 1 tablet (100 mg total) by mouth 2 (two) times daily for 14 days. 28 tablet 0  ? furosemide (LASIX) 40 MG tablet TAKE 1 TABLET BY MOUTH  DAILY , CAN TAKE EXTRA  TABLET DAILY AS NEEDED FOR  EDEMA (Patient taking differently: Take 40-80 mg by mouth See admin instructions. Take 80 mg in the morning and 40 mg at night) 135 tablet 3  ? HUMALOG KWIKPEN 100 UNIT/ML KwikPen Inject 8-18 Units into the skin 4 (four) times daily as needed (high blood sugar).    ? primidone (MYSOLINE) 50 MG tablet Take 100 mg by mouth 2 (two) times daily after a meal.    ? terazosin (HYTRIN) 10 MG capsule TAKE 1 CAPSULE BY MOUTH  DAILY ALONG WITH 5 MG  CAPSULE 90 capsule 0  ? terazosin (HYTRIN) 5 MG capsule TAKE 1 CAPSULE BY MOUTH  DAILY ALONG WITH 10 MG  CAPSULE 90 capsule 0  ? Vitamin D, Cholecalciferol, 25 MCG (1000 UT) CAPS Take 1,000 Units by mouth daily.    ? acetaminophen (TYLENOL) 500 MG tablet Take 1,500 mg by mouth daily.    ? calcium carbonate (TUMS - DOSED IN MG ELEMENTAL CALCIUM) 500 MG chewable tablet Chew 1-2 tablets by mouth daily as needed for indigestion or heartburn.    ? empagliflozin (JARDIANCE) 10 MG TABS tablet Take 10 mg by mouth daily.     ? gabapentin (NEURONTIN) 300 MG capsule Take 300 mg by mouth at bedtime as needed (pain).    ? SODIUM BICARBONATE PO Take 648 mg by mouth 4 (four) times daily.    ? ?No current facility-administered medications for this visit.  ? ? ?REVIEW OF SYSTEMS:  ?$RemoveBe'[X]'vNJypfeSc$  denotes positive finding, $RemoveBeforeDEI'[ ]'xzpfiaEfGdiddZYM$  denotes negative finding ?Cardiac  Comments:  ?Chest pain or chest pressure:    ?Shortness of  breath upon exertion:    ?Short of breath when lying flat:    ?Irregular heart rhythm:    ?    ?Vascular    ?Pain in calf, thigh, or hip brought on by ambulation:    ?Pain in feet at night that wakes you up from your sleep:     ?Blood clot in your veins:    ?Leg swelling:     ?    ?Pulmonary    ?Oxygen at home:    ?Productive cough:     ?Wheezing:     ?    ?Neurologic    ?Sudden weakness in arms or legs:     ?Sudden numbness in arms or legs:     ?Sudden onset of difficulty speaking or slurred speech:    ?Temporary loss of vision in one eye:     ?Problems with dizziness:     ?    ?Gastrointestinal    ?Blood in stool:     ?Vomited blood:     ?    ?Genitourinary    ?Burning when urinating:     ?Blood in urine:    ?    ?Psychiatric    ?Major depression:     ?    ?Hematologic    ?Bleeding problems:    ?Problems with blood clotting too easily:    ?    ?Skin    ?Rashes or ulcers:    ?    ?Constitutional    ?Fever or chills:    ? ?PHYSICAL EXAM:  ? ?Vitals:  ? 02/22/22 1254  ?BP: 137/78  ?Pulse: 80  ?Resp: 20  ?Temp: 98.2 ?F (36.8 ?C)  ?SpO2: 98%  ?Weight: 240 lb (108.9 kg)  ?Height: $RemoveB'6\' 1"'vLVGCEJT$  (1.854 m)  ? ? ?Constitutional: Well appearing in no distress. Appears well nourished.  ?Neurologic: Normal gait and station. CN intact. No weakness. No sensory loss. ?Psychiatric: Mood and affect symmetric and appropriate. ?Eyes: No icterus. No conjunctival pallor. ?Ears, nose, throat: mucous membranes moist. Midline trachea. No carotid bruit. ?Cardiac: regular rate and rhythm.  ?Respiratory: unlabored. ?Abdominal: soft, non-tender, non-distended. No palpable  pulsatile abdominal mass. ?Peripheral vascular: ? Dorsalis pedis pulse: L absent / R absent ? Posterior tibial pulse: L absent / R absent ? <2s capillary refill ? Left toes 1-5 surgically absent ? Right 5th t

## 2022-02-21 NOTE — Progress Notes (Unsigned)
ASSESSMENT & PLAN:  76 y.o. male with currently asymptomatic peripheral arterial disease s/p endovascular intervention in [place].   Recommend:  Complete cessation from all tobacco products. Blood glucose control with goal A1c < 7%. Blood pressure control with goal blood pressure < 140/90 mmHg. Lipid reduction therapy with goal LDL-C <100 mg/dL (<70 if symptomatic from PAD).  Clopidogrel 22m PO QD. Atorvastatin 40-80mg PO QD (or other "high intensity" statin therapy).  Follow up 1 year with repeat ABI and arterial duplex.  CHIEF COMPLAINT:   Establish vascular care  HISTORY:  HISTORY OF PRESENT ILLNESS: Corey Richards a 76y.o. male with history of DM, HF, HLA, HTN, AF, CAD s/p CABG. he has a strong history of peripheral arterial disease.  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  His right fifth toe is also surgically absent.  He is ambulatory.  He is a bit unsteady and ambulates with a cane.  He denies location, ischemic rest pain, ischemic ulceration.  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    Sleep apnea    Stroke ([facility]     Past Surgical History:  Procedure Laterality Date   cataract surgery     coronary artery bypass grafts N/A    peripheral angiography Bilateral     Family History  Problem Relation Age of Onset   Alzheimer's disease Father    Heart attack  Father     Social History   Socioeconomic History   Marital status: Married    Spouse name: Not on file   Number of children: Not on file   Years of education: Not on file   Highest education level: Not on file  Occupational History   Not on file  Tobacco Use   Smoking status: Former    Types: Cigars    Start date: 07/24/1969    Quit date: 07/24/1981    Years since quitting: 40.6   Smokeless tobacco: Never  Vaping Use   Vaping Use: Never used  Substance and Sexual Activity   Alcohol use: Never   Drug use: Not on file   Sexual activity: Yes  Other Topics Concern   Not on file  Social History Narrative   Not on file   Social Determinants of Health   Financial Resource Strain: Low Risk    Difficulty of Paying Living Expenses: Not hard at all  Food Insecurity: No Food Insecurity   Worried About RCharity fundraiserin the Last Year: Never true   Ran Out of Food in the Last Year: Never true  Transportation Needs: No Transportation Needs   Lack of Transportation (Medical): No   Lack of Transportation (Non-Medical): No  Physical Activity: Inactive   Days of Exercise per Week: 0 days   Minutes of Exercise per  Session: 0 min  Stress: No Stress Concern Present   Feeling of Stress : Not at all  Social Connections: Moderately Integrated   Frequency of Communication with Friends and Family: Twice a week   Frequency of Social Gatherings with Friends and Family: Twice a week   Attends Religious Services: 1 to 4 times per year   Active Member of [employer] or Organizations: No   Attends Music therapist: Never   Marital Status: Married  Human resources officer Violence: Not At Risk   Fear of Current or Ex-Partner: No   Emotionally Abused: No   Physically Abused: No   Sexually Abused: No    No Known Allergies  Current Outpatient Medications  Medication Sig Dispense Refill   atorvastatin (LIPITOR) 40 MG tablet Take 1 tablet (40 mg total) by mouth at bedtime. 90 tablet 3    carvedilol (COREG) 12.5 MG tablet Take 1 tablet (12.5 mg total) by mouth 2 (two) times daily with a meal. 180 tablet 1   clopidogrel (PLAVIX) 75 MG tablet TAKE 1 TABLET BY MOUTH DAILY 90 tablet 1   doxycycline (VIBRA-TABS) 100 MG tablet Take 1 tablet (100 mg total) by mouth 2 (two) times daily for 14 days. 28 tablet 0   Dulaglutide (TRULICITY) 8.32 NV/9.75YO SOPN Inject 0.5 mLs into the skin once a week.     empagliflozin (JARDIANCE) 10 MG TABS tablet Take 1 tablet (10 mg total) by mouth daily. 90 tablet 1   furosemide (LASIX) 40 MG tablet TAKE 1 TABLET BY MOUTH  DAILY , CAN TAKE EXTRA  TABLET DAILY AS NEEDED FOR  EDEMA 135 tablet 3   primidone (MYSOLINE) 50 MG tablet Take 50 mg by mouth 2 (two) times daily after a meal.     sodium bicarbonate 650 MG tablet Take 648 mg by mouth 4 (four) times daily.     terazosin (HYTRIN) 10 MG capsule TAKE 1 CAPSULE BY MOUTH  DAILY ALONG WITH 5 MG  CAPSULE 90 capsule 0   terazosin (HYTRIN) 5 MG capsule TAKE 1 CAPSULE BY MOUTH  DAILY ALONG WITH 10 MG  CAPSULE 90 capsule 0   Vitamin D, Cholecalciferol, 25 MCG (1000 UT) CAPS Take 1 capsule by mouth daily.     No current facility-administered medications for this visit.    REVIEW OF SYSTEMS:  _0  denotes positive finding, _1  denotes negative finding Cardiac  Comments:  Chest pain or chest pressure:    Shortness of breath upon exertion:    Short of breath when lying flat:    Irregular heart rhythm:        Vascular    Pain in calf, thigh, or hip brought on by ambulation:    Pain in feet at night that wakes you up from your sleep:     Blood clot in your veins:    Leg swelling:         Pulmonary    Oxygen at home:    Productive cough:     Wheezing:         Neurologic    Sudden weakness in arms or legs:     Sudden numbness in arms or legs:     Sudden onset of difficulty speaking or slurred speech:    Temporary loss of vision in one eye:     Problems with dizziness:         Gastrointestinal    Blood  in stool:     Vomited blood:  Genitourinary    Burning when urinating:     Blood in urine:        Psychiatric    Major depression:         Hematologic    Bleeding problems:    Problems with blood clotting too easily:        Skin    Rashes or ulcers:        Constitutional    Fever or chills:     PHYSICAL EXAM:   There were no vitals filed for this visit.  Constitutional: Well appearing in no distress. Appears well nourished.  Neurologic: Normal gait and station. CN intact. No weakness. No sensory loss. Psychiatric: Mood and affect symmetric and appropriate. Eyes: No icterus. No conjunctival pallor. Ears, nose, throat: mucous membranes moist. Midline trachea. No carotid bruit. Cardiac: regular rate and rhythm.  Respiratory: unlabored. Abdominal: soft, non-tender, non-distended. No palpable pulsatile abdominal mass. Peripheral vascular:  Dorsalis pedis pulse: L absent / R absent  Posterior tibial pulse: L absent / R absent  <2s capillary refill  Left toes 1-5 surgically absent  Right 5th toe surgically absent Extremity: No edema. No cyanosis. No pallor.  Skin: No gangrene. No ulceration.  Lymphatic: No Stemmer's sign. No palpable lymphadenopathy.   DATA REVIEW:   ABI 10/13/20  LOWER EXTREMITY DOPPLER STUDY   Indications: Peripheral artery disease, and Left great toe amputation.   Other Factors: Hx Bilateral stenting at outside facility in [facility].   Comparison Study: no priors at this facility   Performing Technologist: June Leap RDMS, RVT      Examination Guidelines: A complete evaluation includes at minimum, Doppler  waveform signals and systolic blood pressure reading at the level of  bilateral  brachial, anterior tibial, and posterior tibial arteries, when vessel  segments  are accessible. Bilateral testing is considered an integral part of a  complete  examination. Photoelectric Plethysmograph (PPG) waveforms and toe systolic  pressure readings are  included as required and additional duplex testing  as  needed. Limited examinations for reoccurring indications may be performed  as  noted.      ABI Findings:  +---------+------------------+-----+----------+--------+   Right     Rt Pressure (mmHg) Index Waveform   Comment    +---------+------------------+-----+----------+--------+   Brachial  157                                            +---------+------------------+-----+----------+--------+   ATA       189                1.15  monophasic            +---------+------------------+-----+----------+--------+   PTA       147                0.89  monophasic            +---------+------------------+-----+----------+--------+   Great Toe 73                 0.44  Abnormal              +---------+------------------+-----+----------+--------+   +--------+------------------+-----+-------------------+-------+   Left     Lt Pressure (mmHg) Index Waveform            Comment   +--------+------------------+-----+-------------------+-------+   Brachial 165                                                    +--------+------------------+-----+-------------------+-------+  ATA      255                1.55  biphasic                      +--------+------------------+-----+-------------------+-------+   PTA      81                 0.49  dampened monophasic           +--------+------------------+-----+-------------------+-------+           Summary:  Right: Resting right ankle-brachial index is within normal range. ABIs are  unreliable.  Although ankle brachial indices are within normal limits (0.95-1.29),  arterial Doppler waveforms at the ankle suggest some component of arterial  occlusive disease.  Left: Resting left ankle-brachial index indicates noncompressible left  lower extremity arteries. Arterial Doppler waveforms at the ankle suggest  some component of arterial occlusive disease.          *See table(s) above for measurements  and observations.   Corey Richards. Stanford Breed, MD [facility] Phone Number: [phone] 02/21/2022 9:10 PM

## 2022-02-22 ENCOUNTER — Ambulatory Visit (INDEPENDENT_AMBULATORY_CARE_PROVIDER_SITE_OTHER)
Admission: RE | Admit: 2022-02-22 | Discharge: 2022-02-22 | Disposition: A | Discharge status: Home / Self Care | Payer: Medicare Other | Source: Ambulatory Visit | Attending: Vascular Surgery | Admitting: Vascular Surgery

## 2022-02-22 ENCOUNTER — Ambulatory Visit (HOSPITAL_COMMUNITY)
Admission: RE | Admit: 2022-02-22 | Discharge: 2022-02-22 | Disposition: A | Discharge status: Home / Self Care | Payer: Medicare Other | Source: Ambulatory Visit | Attending: Vascular Surgery | Admitting: Vascular Surgery

## 2022-02-22 ENCOUNTER — Ambulatory Visit: Payer: Medicare Other | Admitting: Vascular Surgery

## 2022-02-22 ENCOUNTER — Encounter: Payer: Self-pay | Admitting: Vascular Surgery

## 2022-02-22 ENCOUNTER — Telehealth: Payer: Self-pay

## 2022-02-22 ENCOUNTER — Other Ambulatory Visit: Payer: Self-pay

## 2022-02-22 VITALS — BP 137/78 | HR 80 | Temp 98.2°F | Resp 20 | Ht 73.0 in | Wt 240.0 lb

## 2022-02-22 DIAGNOSIS — I739 Peripheral vascular disease, unspecified: Secondary | ICD-10-CM

## 2022-02-22 NOTE — Telephone Encounter (Signed)
Patient called to let you know that he was seen by vascular this morning and that he is scheduled for an angiogram this Friday 02/25/22. He wants to wait until after this to decide what to do about his MRI results. He will be in contact with [place] ?

## 2022-02-23 ENCOUNTER — Other Ambulatory Visit: Payer: Self-pay

## 2022-02-24 ENCOUNTER — Other Ambulatory Visit: Payer: Self-pay | Admitting: Cardiology

## 2022-02-24 MED ORDER — EMPAGLIFLOZIN 10 MG PO TABS: 10.0000 mg | ORAL_TABLET | Freq: Every day | ORAL | 3 refills | Status: AC

## 2022-02-25 ENCOUNTER — Encounter (HOSPITAL_COMMUNITY)
Admission: RE | Disposition: A | Discharge status: Home / Self Care | Payer: Self-pay | Source: Home / Self Care | Attending: Vascular Surgery

## 2022-02-25 ENCOUNTER — Ambulatory Visit (HOSPITAL_COMMUNITY)
Admission: RE | Admit: 2022-02-25 | Discharge: 2022-02-25 | Disposition: A | Discharge status: Home / Self Care | Payer: Medicare Other | Attending: Vascular Surgery | Admitting: Vascular Surgery

## 2022-02-25 ENCOUNTER — Other Ambulatory Visit: Payer: Self-pay

## 2022-02-25 DIAGNOSIS — Z7902 Long term (current) use of antithrombotics/antiplatelets: Secondary | ICD-10-CM | POA: Diagnosis not present

## 2022-02-25 DIAGNOSIS — E1151 Type 2 diabetes mellitus with diabetic peripheral angiopathy without gangrene: Secondary | ICD-10-CM | POA: Diagnosis not present

## 2022-02-25 DIAGNOSIS — I509 Heart failure, unspecified: Secondary | ICD-10-CM | POA: Insufficient documentation

## 2022-02-25 DIAGNOSIS — Z794 Long term (current) use of insulin: Secondary | ICD-10-CM | POA: Insufficient documentation

## 2022-02-25 DIAGNOSIS — E11621 Type 2 diabetes mellitus with foot ulcer: Secondary | ICD-10-CM | POA: Diagnosis not present

## 2022-02-25 DIAGNOSIS — Z89422 Acquired absence of other left toe(s): Secondary | ICD-10-CM | POA: Insufficient documentation

## 2022-02-25 DIAGNOSIS — E785 Hyperlipidemia, unspecified: Secondary | ICD-10-CM | POA: Insufficient documentation

## 2022-02-25 DIAGNOSIS — I70235 Atherosclerosis of native arteries of right leg with ulceration of other part of foot: Secondary | ICD-10-CM | POA: Diagnosis not present

## 2022-02-25 DIAGNOSIS — I110 Hypertensive heart disease with heart failure: Secondary | ICD-10-CM | POA: Diagnosis not present

## 2022-02-25 DIAGNOSIS — Z951 Presence of aortocoronary bypass graft: Secondary | ICD-10-CM | POA: Diagnosis not present

## 2022-02-25 DIAGNOSIS — Z8673 Personal history of transient ischemic attack (TIA), and cerebral infarction without residual deficits: Secondary | ICD-10-CM | POA: Insufficient documentation

## 2022-02-25 DIAGNOSIS — Z7984 Long term (current) use of oral hypoglycemic drugs: Secondary | ICD-10-CM | POA: Diagnosis not present

## 2022-02-25 DIAGNOSIS — Z87891 Personal history of nicotine dependence: Secondary | ICD-10-CM | POA: Diagnosis not present

## 2022-02-25 DIAGNOSIS — L97519 Non-pressure chronic ulcer of other part of right foot with unspecified severity: Secondary | ICD-10-CM | POA: Diagnosis not present

## 2022-02-25 DIAGNOSIS — I2510 Atherosclerotic heart disease of native coronary artery without angina pectoris: Secondary | ICD-10-CM | POA: Diagnosis not present

## 2022-02-25 DIAGNOSIS — T82856A Stenosis of peripheral vascular stent, initial encounter: Secondary | ICD-10-CM | POA: Insufficient documentation

## 2022-02-25 DIAGNOSIS — Y832 Surgical operation with anastomosis, bypass or graft as the cause of abnormal reaction of the patient, or of later complication, without mention of misadventure at the time of the procedure: Secondary | ICD-10-CM | POA: Insufficient documentation

## 2022-02-25 DIAGNOSIS — I480 Paroxysmal atrial fibrillation: Secondary | ICD-10-CM | POA: Insufficient documentation

## 2022-02-25 DIAGNOSIS — Z79899 Other long term (current) drug therapy: Secondary | ICD-10-CM | POA: Diagnosis not present

## 2022-02-25 HISTORY — PX: ABDOMINAL AORTOGRAM W/LOWER EXTREMITY: CATH118223

## 2022-02-25 HISTORY — PX: PERIPHERAL VASCULAR INTERVENTION: CATH118257

## 2022-02-25 LAB — POCT I-STAT, CHEM 8
BUN: 82 mg/dL — ABNORMAL HIGH (ref 8–23)
Calcium, Ion: 1.16 mmol/L (ref 1.15–1.40)
Chloride: 100 mmol/L (ref 98–111)
Creatinine, Ser: 3.2 mg/dL — ABNORMAL HIGH (ref 0.61–1.24)
Glucose, Bld: 208 mg/dL — ABNORMAL HIGH (ref 70–99)
HCT: 40 % (ref 39.0–52.0)
Hemoglobin: 13.6 g/dL (ref 13.0–17.0)
Potassium: 3.6 mmol/L (ref 3.5–5.1)
Sodium: 138 mmol/L (ref 135–145)
TCO2: 27 mmol/L (ref 22–32)

## 2022-02-25 SURGERY — ABDOMINAL AORTOGRAM W/LOWER EXTREMITY
Anesthesia: LOCAL | Laterality: Right

## 2022-02-25 MED ORDER — FENTANYL CITRATE (PF) 100 MCG/2ML IJ SOLN
INTRAMUSCULAR | Status: DC | PRN
  Administered 2022-02-25 (×2): 25 ug via INTRAVENOUS

## 2022-02-25 MED ORDER — ASPIRIN EC 325 MG PO TBEC: 325.0000 mg | DELAYED_RELEASE_TABLET | Freq: Every day | ORAL | Status: DC

## 2022-02-25 MED ORDER — SODIUM CHLORIDE 0.9 % WEIGHT BASED INFUSION: 1.0000 mL/kg/h | INTRAVENOUS | Status: DC

## 2022-02-25 MED ORDER — HEPARIN SODIUM (PORCINE) 1000 UNIT/ML IJ SOLN
INTRAMUSCULAR | Status: DC | PRN
Start: 2022-02-25 — End: 2022-02-25
  Administered 2022-02-25: 10000 [IU] via INTRAVENOUS

## 2022-02-25 MED ORDER — ONDANSETRON HCL 4 MG/2ML IJ SOLN: 4.0000 mg | Freq: Four times a day (QID) | INTRAMUSCULAR | Status: DC | PRN

## 2022-02-25 MED ORDER — LIDOCAINE HCL (PF) 1 % IJ SOLN
INTRAMUSCULAR | Status: DC | PRN
Start: 2022-02-25 — End: 2022-02-25
  Administered 2022-02-25: 12 mL

## 2022-02-25 MED ORDER — HEPARIN (PORCINE) IN NACL 1000-0.9 UT/500ML-% IV SOLN
INTRAVENOUS | Status: DC | PRN
  Administered 2022-02-25 (×2): 500 mL

## 2022-02-25 MED ORDER — LABETALOL HCL 5 MG/ML IV SOLN: 10.0000 mg | INTRAVENOUS | Status: DC | PRN

## 2022-02-25 MED ORDER — MIDAZOLAM HCL 2 MG/2ML IJ SOLN
INTRAMUSCULAR | Status: AC
  Filled 2022-02-25: qty 2

## 2022-02-25 MED ORDER — SODIUM CHLORIDE 0.9 % IV SOLN: INTRAVENOUS | Status: DC

## 2022-02-25 MED ORDER — LIDOCAINE HCL (PF) 1 % IJ SOLN
INTRAMUSCULAR | Status: AC
  Filled 2022-02-25: qty 30

## 2022-02-25 MED ORDER — ACETAMINOPHEN 325 MG PO TABS: 650.0000 mg | ORAL_TABLET | ORAL | Status: DC | PRN

## 2022-02-25 MED ORDER — HEPARIN (PORCINE) IN NACL 1000-0.9 UT/500ML-% IV SOLN
INTRAVENOUS | Status: AC
Start: 2022-02-25 — End: ?
  Filled 2022-02-25: qty 1000

## 2022-02-25 MED ORDER — SODIUM CHLORIDE 0.9 % IV SOLN: 250.0000 mL | INTRAVENOUS | Status: DC | PRN

## 2022-02-25 MED ORDER — MIDAZOLAM HCL 2 MG/2ML IJ SOLN
INTRAMUSCULAR | Status: DC | PRN
Start: 2022-02-25 — End: 2022-02-25
  Administered 2022-02-25 (×2): 1 mg via INTRAVENOUS

## 2022-02-25 MED ORDER — IODIXANOL 320 MG/ML IV SOLN
INTRAVENOUS | Status: DC | PRN
  Administered 2022-02-25: 40 mL

## 2022-02-25 MED ORDER — SODIUM CHLORIDE 0.9% FLUSH: 3.0000 mL | Freq: Two times a day (BID) | INTRAVENOUS | Status: DC

## 2022-02-25 MED ORDER — SODIUM CHLORIDE 0.9% FLUSH: 3.0000 mL | INTRAVENOUS | Status: DC | PRN

## 2022-02-25 MED ORDER — HYDRALAZINE HCL 20 MG/ML IJ SOLN: 5.0000 mg | INTRAMUSCULAR | Status: DC | PRN

## 2022-02-25 MED ORDER — FENTANYL CITRATE (PF) 100 MCG/2ML IJ SOLN
INTRAMUSCULAR | Status: AC
  Filled 2022-02-25: qty 2

## 2022-02-25 SURGICAL SUPPLY — 25 items
BAG SNAP BAND KOVER 36X36 (MISCELLANEOUS) ×1 IMPLANT
BALLN MUSTANG 6X60X135 (BALLOONS) ×2
BALLOON MUSTANG 6X60X135 (BALLOONS) IMPLANT
CATH OMNI FLUSH 5F 65CM (CATHETERS) ×1 IMPLANT
CATH TEMPO AQUA 5F 100CM (CATHETERS) ×1 IMPLANT
CLOSURE PERCLOSE PROSTYLE (VASCULAR PRODUCTS) ×1 IMPLANT
DCB RANGER 4.0X40 135 (BALLOONS) IMPLANT
DEVICE CLOSURE MYNXGRIP 6/7F (Vascular Products) ×1 IMPLANT
GLIDEWIRE ADV .035X260CM (WIRE) ×1 IMPLANT
KIT ANGIASSIST CO2 SYSTEM (KITS) ×1 IMPLANT
KIT ENCORE 26 ADVANTAGE (KITS) ×1 IMPLANT
KIT MICROPUNCTURE NIT STIFF (SHEATH) ×1 IMPLANT
KIT PV (KITS) ×2 IMPLANT
RANGER DCB 4.0X40 135 (BALLOONS) ×2
SHEATH PINNACLE 5F 10CM (SHEATH) ×1 IMPLANT
SHEATH PINNACLE 6F 10CM (SHEATH) ×1 IMPLANT
SHEATH PINNACLE MP 6F 45CM (SHEATH) ×1 IMPLANT
STENT ELUVIA 7X80X130 (Permanent Stent) ×2 IMPLANT
STENT ONYX FRONTIER 4.0X22 (Permanent Stent) ×1 IMPLANT
SYR MEDRAD MARK V 150ML (SYRINGE) ×1 IMPLANT
TRANSDUCER W/STOPCOCK (MISCELLANEOUS) ×2 IMPLANT
TRAY PV CATH (CUSTOM PROCEDURE TRAY) ×2 IMPLANT
WIRE BENTSON .035X145CM (WIRE) ×1 IMPLANT
WIRE G V18X300CM (WIRE) ×1 IMPLANT
WIRE SPARTACORE .014X300CM (WIRE) ×1 IMPLANT

## 2022-02-25 NOTE — Progress Notes (Signed)
Pt ambulated without difficulty or bleeding.   Discharged home with wife who will drive and stay with pt x 24 hrs 

## 2022-02-25 NOTE — Discharge Instructions (Addendum)
?  Start taking Baby aspirin '81mg'$  daily ? ?Femoral Site Care ?This sheet gives you information about how to care for yourself after your procedure. Your health care provider may also give you more specific instructions. If you have problems or questions, contact your health care provider. ?What can I expect after the procedure? ?After the procedure, it is common to have: ?Bruising that usually fades within 1-2 weeks. ?Tenderness at the site. ?Follow these instructions at home: ?Wound care ?May remove bandage after 24 hours. ?Do not take baths, swim, or use a hot tub for 5 days. ?You may shower 24-48 hours after the procedure. ?Gently wash the site with plain soap and water. ?Pat the area dry with a clean towel. ?Do not rub the site. This may cause bleeding. ?Do not apply powder or lotion to the site. Keep the site clean and dry. ?Check your femoral site every day for signs of infection. Check for: ?Redness, swelling, or pain. ?Fluid or blood. ?Warmth. ?Pus or a bad smell. ?Activity ?For the first 2-3 days after your procedure, or as long as directed: ?Avoid climbing stairs as much as possible. ?Do not squat. ?Do not lift, push or pull anything that is heavier than 10 lb for 5 days. ?Rest as directed. ?Avoid sitting for a long time without moving. Get up to take short walks every 1-2 hours. ?Do not drive for 24 hours. ?General instructions ?Take over-the-counter and prescription medicines only as told by your health care provider. ?Keep all follow-up visits as told by your health care provider. This is important. ?DRINK PLENTY OF FLUIDS FOR THE NEXT 2-3 DAYS. ?Contact a health care provider if you have: ?A fever or chills. ?You have redness, swelling, or pain around your insertion site. ?Get help right away if: ?The catheter insertion area swells very fast. ?You pass out. ?You suddenly start to sweat or your skin gets clammy. ?The catheter insertion area is bleeding, and the bleeding does not stop when you hold steady  pressure on the area. ?The area near or just beyond the catheter insertion site becomes pale, cool, tingly, or numb. ?These symptoms may represent a serious problem that is an emergency. Do not wait to see if the symptoms will go away. Get medical help right away. Call your local emergency services (911 in the [place] Do not drive yourself to the hospital. ?Summary ?After the procedure, it is common to have bruising that usually fades within 1-2 weeks. ?Check your femoral site every day for signs of infection. ?Do not lift, push or pull anything that is heavier than 10 lb for 5 days. ? ?This information is not intended to replace advice given to you by your health care provider. Make sure you discuss any questions you have with your health care provider. ?Document Revised: 12/11/2017 Document Reviewed: 12/11/2017 ?Elsevier Patient Education ? [place].  ?

## 2022-02-25 NOTE — Op Note (Signed)
DATE OF SERVICE: 02/25/2022 ? ?PATIENT:  Corey Richards  76 y.o. male ? 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 ?POST-OPERATIVE DIAGNOSIS:  Same ? ?PROCEDURE:   ?1) US guided left common femoral artery access ?2) Aortogram ?3) Right lower extremity angiogram with third order cannulation (1m total contrast) ?4) Right superficial femoral artery angioplasty and stenting ([id number]) ?5) Right above knee popliteal artery angioplasty and stenting ([id number]) ?6) Right below knee popliteal / tibioperoneal angioplasty and stenting (4x22m53myx) ?7) Conscious sedation (70 minutes) ? ?SURGEON:  ThomYevonne AlinewkStanford Breed ? ?ASSISTANT: none ? ?ANESTHESIA:   local and IV sedation ? ?ESTIMATED BLOOD LOSS: minimal ? ?LOCAL MEDICATIONS USED:  LIDOCAINE  ? ?COUNTS: confirmed correct. ? ?PATIENT DISPOSITION:  PACU - hemodynamically stable. ?  ?Delay start of Pharmacological VTE agent (>24hrs) due to surgical blood loss or risk of bleeding: no ? ?INDICATION FOR PROCEDURE: Corey Richards 76 y58. male with right great toe ulceration with a strong history of peripheral vascular disease. After careful discussion of risks, benefits, and alternatives the patient was offered angiography with possible intervention. The patient understood and wished to proceed. ? ?OPERATIVE FINDINGS:  ?Terminal aorta and iliac arteries: ?Widely patent without flow limiting stenosis ? ?Right lower extremity: ?Common femoral artery: heavily diseased, calcified, no flow limiting stenosis  ?Profunda femoris artery: heavily diseased, calcified, no flow limiting stenosis   ?Superficial femoral artery: heavily diseased, calcified. Severe stenosis near areas of previous stenting.  ?Popliteal artery:  Two areas of severe stenosis near areas of previous stenting - one above knee and one below knee. ?Anterior tibial artery: occludes several centimeters distal to its origin.  Reconstitution about the ankle via peroneal collaterals ?Tibioperoneal trunk: Prior stenting, mild disease in proximal aspect of stent ?Peroneal artery: dominant calf vessel courses to ankle and gives robust collaterals to the AT, PT and pedal circulation ?Posterior tibial artery: occludes several centimeters distal to its origin. Reconstitution about the ankle via peroneal collaterals ?Pedal circulation: disadvantaged; pedal arches appear intact. Foot fills via peroneal collateralization / AT / PT. ? ?WIfI score. Clincal stage 3. Moderate amputation risk. High potential benefit from revascularization. ? ?DESCRIPTION OF PROCEDURE: After identification of the patient in the pre-operative holding area, the patient was transferred to the operating room. The patient was positioned supine on the operating room table. Anesthesia was induced. The groins was prepped and draped in standard fashion. A surgical pause was performed confirming correct patient, procedure, and operative location. ? ?The left groin was anesthetized with subcutaneous injection of 1% lidocaine. Using ultrasound guidance, the left common femoral artery was accessed with micropuncture technique. Fluoroscopy was used to confirm cannulation over the femoral head. The 41F sheath was upsized to 72F.  ? ?A Benson wire was advanced into the distal aorta. Over the wire an omni flush catheter was advanced to the level of L2. Aortogram was performed - see above for details.  ? ?The right common iliac artery was selected with a Benson guidewire. The wire was advanced into the common femoral artery. Over the wire the omni flush catheter was advanced into the external iliac artery. Selective angiography was performed - see above for details.  ? ?The decision was made to intervene. The patient was heparinized with 10,000 units of heparin. The 72F sheath was exchanged for a 19F x 45cm sheath. Selective angiography of the left lower extremity was performed prior to  intervention.  ? ?The lesions were  treated with: ?Right superficial femoral artery angioplasty and stenting (7x64m Eluvia) ?Right above knee popliteal artery angioplasty and stenting (7x840mEluvia) ?Right below knee popliteal / tibioperoneal angioplasty and stenting (4x2291mnyx) ? ?Completion angiography revealed:  ?Resolution of in-stent and juxta-stent stenosis; below knee popliteal stenosis ? ?A mynx device was used to close the arteriotomy. Hemostasis was excellent upon completion. ? ?Conscious sedation was administered with the use of IV fentanyl and midazolam under continuous physician and nurse monitoring.  Heart rate, blood pressure, and oxygen saturation were continuously monitored.  Total sedation time was 70 minutes ? ?Upon completion of the case instrument and sharps counts were confirmed correct. The patient was transferred to the PACU in good condition. I was present for all portions of the procedure. ? ?PLAN: ASA '325mg'$  PO x1 today. ASA '81mg'$  PO QD indefinitely thereafter. Continue Plavix '75mg'$  PO QD. High intensity statin therapy. May be a candidate for Repatha. Will discuss with his cardiologist. Will see him back in the clinic in 1-2 weeks for toe check / ABI.  ? ?ThoYevonne AlineawStanford BreedD ?[facility] Phone Number: [phone]/17/2023 10:58 AM ? ?

## 2022-02-25 NOTE — Interval H&P Note (Signed)
History and Physical Interval Note: ? ?02/25/2022 ?9:26 AM ? ?Corey Richards  has presented today for surgery, with the diagnosis of atherosclerosis of native arteries with ulceration.  The various methods of treatment have been discussed with the patient and family. After consideration of risks, benefits and other options for treatment, the patient has consented to  Procedure(s): ?ABDOMINAL AORTOGRAM W/LOWER EXTREMITY (N/A) as a surgical intervention.  The patient's history has been reviewed, patient examined, no change in status, stable for surgery.  I have reviewed the patient's chart and labs.  Questions were answered to the patient's satisfaction.   ? ? ?[name] ? ? ?

## 2022-02-28 ENCOUNTER — Telehealth: Payer: Self-pay | Admitting: *Deleted

## 2022-02-28 ENCOUNTER — Encounter (HOSPITAL_COMMUNITY): Payer: Self-pay | Admitting: Vascular Surgery

## 2022-02-28 ENCOUNTER — Telehealth: Payer: Self-pay | Admitting: Podiatry

## 2022-02-28 NOTE — Telephone Encounter (Signed)
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  This actually occurred X 2 per his report.  Two weeks ago he felt this same kind of pain only in his left forearm.  Pt is asking if there is any testing to be done to see if his "stents are still good".  He does also mention he has not done well with stress tests in the past.  Advised I will forward this information to Dr Marlou Porch for review and any further orders. ? ?Discussed with pt starting zetia 10 mg daily to help bring his LDL to goal of less than 70.  Pt reports he is having blood work soon and will have his cholesterol rechecked since his last LDL was 71.  Pt states he will have these results sent to Dr Marlou Porch for his review.   ?

## 2022-02-28 NOTE — Telephone Encounter (Signed)
Attempted to contact pt regarding Dr Marlou Porch' order to start Zetia 10 mg a day.  Left message to call back. ?Also, last LDL in our system is from 07/02/21 and was a direct LDL of 71.  Dr Marlou Porch is adding zetia 10 to reach pt's goal of 17 or less.  If pt has had this rechecked since then and is already at goal he may not need to start this medication. ? ?Per documentation from Dr Jamelle Haring pt has been "reporting some symptoms similar to previous heart attacks" Pt describing left forearm discomfort which needs to be triaged for possible further assessment.   ?

## 2022-02-28 NOTE — Telephone Encounter (Signed)
Pts wife called and canceled the appt for this week and wanted me to let you know that he is continuing the follow up care with the vascular provider. ?

## 2022-02-28 NOTE — Telephone Encounter (Signed)
-----   Message from Jerline Pain, MD sent at 02/25/2022  4:57 PM EDT ----- ?Thank you for the update ?His LDL last check was 71 on atorvastatin 40 mg. ?Pretty close to our goals of less than 70.  I think that perhaps in addition of Zetia 10 mg a day to his atorvastatin may make good sense. ? ?I will ask my nurse Pam to start the medication Zetia 10 mg once a day. ?-Corey Richards ?----- Message ----- ?From: [name], MD ?Sent: 02/25/2022  11:24 AM EDT ?To: Jerline Pain, MD ? ?Hi Corey Richards, ? ?Had a good technical result from angiogram today. I am hopeful we will be able to salvage his foot. He has an impressive burden of disease in some unusual places. I was wondering if he might be a candidate for Repatha. He is also reporting some symptoms similar to previous heart attacks. He is describing left forearm discomfort, which I told him is a bit atypical for angina / ACS etc. I told him to try to get in to see you to talk some more about it. ? ?Let me know what you think, ? ?Corey Richards  ?[phone] ? ? ?

## 2022-03-03 ENCOUNTER — Ambulatory Visit: Payer: Medicare Other | Admitting: Podiatry

## 2022-03-03 ENCOUNTER — Other Ambulatory Visit: Payer: Self-pay | Admitting: Podiatry

## 2022-03-03 ENCOUNTER — Telehealth: Payer: Self-pay

## 2022-03-03 DIAGNOSIS — G4733 Obstructive sleep apnea (adult) (pediatric): Secondary | ICD-10-CM | POA: Diagnosis not present

## 2022-03-03 NOTE — Telephone Encounter (Signed)
Called pt and scheduled them to come in 3.31 in [place] for an appt. ?

## 2022-03-03 NOTE — Telephone Encounter (Signed)
Please advise 

## 2022-03-03 NOTE — Telephone Encounter (Signed)
This patient called requesting a refill on Doxy. I investigated his chart and it looks like Dr. Blenda Mounts prescribed the abx for a wound on his toe. Patient is under the assumption that he was told to get a refill from [facility]. I advised that typically we wouldn't refill the abx because we didn't initiate the RX also he would need to be seen prior to continuing the abx. I instructed him to contact Dr.Sikora's office but he stated he wanted Dr.Hawken to handle the rx. I advised pt's wife that I spoke with Dr.Hawken and he stated Dr.Sikora is in charge of the patient's foot care and he will need to contact Dr.Sikora's office to determine if he will need to continue the abx. Wife voiced her understanding.  ?

## 2022-03-03 NOTE — Telephone Encounter (Signed)
Patient's wife is requesting a refill of antibiotic(doxycycline) to be sent to [facility]. Please advise. ?

## 2022-03-03 NOTE — Telephone Encounter (Signed)
Jerline Pain, MD  You 2 days ago  ? ?Lets have him come in to discuss  ?Creatinine is 3.  Limited options.  ?Candee Furbish, MD   ? ?Left message for pt to call back to schedule follow up appt. ?

## 2022-03-03 NOTE — Telephone Encounter (Signed)
Pt has been scheduled for 3/29 @ 8:30 am for further evaluation with Dr Marlou Porch.   ?

## 2022-03-07 ENCOUNTER — Telehealth: Payer: Self-pay | Admitting: *Deleted

## 2022-03-07 NOTE — Progress Notes (Addendum)
?Cardiology Office Note:   ? ?Date:  03/09/2022  ? ?ID:  Yaw Escoto, DOB 1946/01/06, MRN [id number] ? ?PCP:  Libby Maw, MD ?  ?[place]  ?Cardiologist:  Candee Furbish, MD  ?Advanced Practice Provider:  No care team member to display ?Electrophysiologist:  None  ?    ? ?Referring MD: Libby Maw,*  ? ? ?History of Present Illness:   ? ?Overton Boggus is a 76 y.o. male here for CAD follow-up post CABG November 2018 and perioperative paroxysmal atrial fibrillation. Amiodarone finally stopped after 30-day event monitor showed sinus rhythm. He had a stroke in 2016 prior to CABG. ? 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  Prior EF 40% in 2018.  He had two-vessel CABG secondary to RCA being an adequate target for grafting.  A LIMA graft to the LAD and saphenous vein graft to obtuse marginal were present. ? ?In review of outside records from [facility], had non-STEMI and bypass in 2018 ? ?Also has peripheral vascular disease 90% right SFA, occluded left SFA.  He had a right SFA atherectomy in 2017 and then 2 stents at [facility].  He had a left SFA atherectomy and PTA in 2017. ? ?His RCA stent in 2000 was a 5 x 18 mm MultiLink.  He then had another stent in 2002, 2.5 x 13 mm MultiLink. ? ?At his last visit, he was doing well. His nephrologist stopped his dosage of entresto 3 months ago in order to balance his fluid levels. He was also told to stop 5.27 mg trulicity. Was still on 10 mg jardiance and 12.5 mg carvedilol.  ? ?LDL-49 in May 2022. He is managing his cholesterol levels with 40 mg Lipitor and reports no problems. ? ?Today, he is doing well and accompanied by his wife. His only symptom of MI in 2000 was L-arm pain. In the following years, he had several stents placed and 2x CABG. Recently, he noticed L arm discomfort that worries him. 2 weeks ago, 3 stents were placed in his R leg for a total of 10 stents. He denies any palpitations, chest  pain, or shortness of breath, lightheadedness, headaches, syncope, orthopnea, PND, lower extremity edema or exertional symptoms. ? ?Past Medical History:  ?Diagnosis Date  ? Abnormal liver enzymes   ? Acute renal failure (ARF) (HCC)   ? Anemia   ? Benign prostatic hyperplasia   ? CHF (congestive heart failure) ([place])   ? Coronary arteriosclerosis   ? Diabetes mellitus without complication ([place])   ? Hyperlipidemia   ? Hypertension   ? Pain in joint of left hip   ? Pain in joint of right hip   ? Paroxysmal atrial fibrillation (HCC)   ? Peripheral edema   ? Peripheral vascular disease ([place])   ? 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 Sleep apnea   ? Stroke [facility])   ? ? ?Past Surgical History:  ?Procedure Laterality Date  ? ABDOMINAL AORTOGRAM W/LOWER EXTREMITY Right 02/25/2022  ? Procedure: ABDOMINAL AORTOGRAM W/LOWER EXTREMITY;  Surgeon: [name], MD;  Location: [place] [facility];  Service: Cardiovascular;  Laterality: Right;  ? cataract surgery    ? coronary artery bypass grafts N/A   ? peripheral angiography Bilateral   ? PERIPHERAL VASCULAR INTERVENTION Right 02/25/2022  ? Procedure: PERIPHERAL VASCULAR INTERVENTION;  Surgeon: [name], MD;  Location: [place] [facility];  Service: Cardiovascular;  Laterality: Right;  SFA/ Popliteal  ? ? ?Current Medications: ?Current Meds  ?Medication Sig  ? acetaminophen (TYLENOL) 500 MG tablet Take 1,500 mg by mouth daily.  ? atorvastatin (LIPITOR) 40 MG tablet Take 1 tablet (40 mg total) by mouth at bedtime.  ? calcium carbonate (TUMS - DOSED IN MG ELEMENTAL CALCIUM) 500 MG chewable tablet Chew 1-2 tablets by mouth daily as needed for indigestion or heartburn.  ? carvedilol (COREG) 12.5 MG tablet TAKE 1 TABLET BY MOUTH TWICE  DAILY WITH A MEAL  ? clindamycin (CLEOCIN) 300 MG capsule Take 1 capsule (300 mg total) by mouth 3 (three) times daily for 7 days.  ? clopidogrel (PLAVIX) 75 MG tablet TAKE 1 TABLET BY MOUTH DAILY  ? empagliflozin (JARDIANCE)  10 MG TABS tablet Take 1 tablet (10 mg total) by mouth daily.  ? furosemide (LASIX) 40 MG tablet TAKE 1 TABLET BY MOUTH  DAILY CAN TAKE EXTRA TABLET DAILY AS NEEDED FOR EDEMA  ? gabapentin (NEURONTIN) 300 MG capsule Take 300 mg by mouth at bedtime as needed (pain).  ? HUMALOG KWIKPEN 100 UNIT/ML KwikPen Inject 8-18 Units into the skin 4 (four) times daily as needed (high blood sugar).  ? primidone (MYSOLINE) 50 MG tablet Take 100 mg by mouth 2 (two) times daily after a meal.  ? SODIUM BICARBONATE PO Take 648 mg by mouth 4 (four) times daily.  ? sulfamethoxazole-trimethoprim (BACTRIM DS) 800-160 MG tablet Take 1 tablet by mouth 2 (two) times daily for 7 days.  ? terazosin (HYTRIN) 10 MG capsule TAKE 1 CAPSULE BY MOUTH DAILY  ALONG WITH 5 MG CAPSULE  ? terazosin (HYTRIN) 5 MG capsule TAKE 1 CAPSULE BY MOUTH DAILY  ALONG WITH 10 MG CAPSULE  ? Vitamin D, Cholecalciferol, 25 MCG (1000 UT) CAPS Take 1,000 Units by mouth daily.  ?  ? ?Allergies:   Patient has no known allergies.  ? ?Social History  ? ?Socioeconomic History  ? Marital status: Married  ?  Spouse name: Not on file  ? Number of children: Not on file  ? Years of education: Not on file  ? Highest education level: Not on file  ?Occupational History  ? Not on file  ?Tobacco Use  ? Smoking status: Former  ?  Types: Cigars  ?  Start date: 07/24/1969  ?  Quit date: 07/24/1981  ?  Years since quitting: 40.6  ? Smokeless tobacco: Never  ?Vaping Use  ? Vaping Use: Never used  ?Substance and Sexual Activity  ? Alcohol use: Never  ? Drug use: Not on file  ? Sexual activity: Yes  ?Other Topics Concern  ? Not on file  ?Social History Narrative  ? Not on file  ? ?Social Determinants of Health  ? ?Financial Resource Strain: Low Risk   ? Difficulty of Paying Living Expenses: Not hard at all  ?Food Insecurity: No Food Insecurity  ? Worried About Charity fundraiser in the Last Year: Never true  ? Ran Out of Food in the Last Year: Never true  ?Transportation Needs: No  Transportation Needs  ? Lack of Transportation (Medical): No  ? Lack of Transportation (Non-Medical): No  ?Physical Activity: Inactive  ? Days of Exercise per Week: 0 days  ? Minutes of Exercise per Session: 0 min  ?Stress: No Stress Concern Present  ? Feeling of Stress : Not at all  ?Social Connections: Moderately Integrated  ? Frequency of Communication with Friends and Family: Twice a week  ? Frequency of Social Gatherings with Friends  and Family: Twice a week  ? Attends Religious Services: 1 to 4 times per year  ? Active Member of Clubs or Organizations: No  ? Attends Archivist Meetings: Never  ? Marital Status: Married  ?  ? ?Family History: ?The patient's family history includes Alzheimer's disease in his father; Heart attack in his father. ? ?ROS:   ?Please see the history of present illness.   ?(+) L arm pain  ?All other systems reviewed and are negative. ? ?EKGs/Labs/Other Studies Reviewed:   ? ?The following studies were reviewed today: ?Vascular ABIs 02/22/22 ?Previous ABI on 10/13/20.  ?   ?Summary:  ?Right: Resting right ankle-brachial index indicates noncompressible right lower extremity arteries.  ? ?Left: Resting left ankle-brachial index indicates noncompressible left lower extremity arteries.  ? ?ECHO 2021: ? 1. Technically challenging study. There is global hypokinesis, but there also appears to be focal areas of more severe hypokinesis as well. However, not all walls able to be well visualized. Recommend future  ?studies be done with echo contrast.. Left ventricular ejection fraction, by estimation, is 35 to 40%. The left  ?ventricle has moderately decreased function. The left ventricle demonstrates regional wall motion abnormalities (see scoring diagram/findings for description). The left ventricular internal cavity size was moderately to severely dilated. Left ventricular diastolic parameters are indeterminate.  ? 2. Right ventricular systolic function was not well visualized. The  right ventricular size is not well visualized. Tricuspid regurgitation signal is inadequate for assessing PA pressure.  ? 3. Left atrial size was moderately dilated.  ? 4. Right atrial size was moderately dilated.  ?

## 2022-03-07 NOTE — Telephone Encounter (Signed)
Patient is calling because the  medicatiion (Doxycyclline) is causing ingestion, ripping up his stomach, has stopped taking , would like something else prescribed. Please advise. ?

## 2022-03-08 ENCOUNTER — Telehealth: Payer: Self-pay | Admitting: *Deleted

## 2022-03-08 ENCOUNTER — Other Ambulatory Visit: Payer: Self-pay | Admitting: Podiatry

## 2022-03-08 MED ORDER — SULFAMETHOXAZOLE-TRIMETHOPRIM 800-160 MG PO TABS: 1.0000 | ORAL_TABLET | Freq: Two times a day (BID) | ORAL | 0 refills | Status: AC

## 2022-03-08 NOTE — Telephone Encounter (Signed)
Patient is calling because the recent medicine sent to pharmacy is awful, would like something else that does not hurt his kidneys. Please advise. ?

## 2022-03-09 ENCOUNTER — Other Ambulatory Visit: Payer: Self-pay | Admitting: Podiatry

## 2022-03-09 ENCOUNTER — Encounter: Payer: Self-pay | Admitting: Cardiology

## 2022-03-09 ENCOUNTER — Other Ambulatory Visit: Payer: Self-pay

## 2022-03-09 ENCOUNTER — Ambulatory Visit: Payer: Medicare Other | Admitting: Cardiology

## 2022-03-09 VITALS — BP 112/70 | HR 76 | Ht 73.0 in | Wt 245.0 lb

## 2022-03-09 DIAGNOSIS — R079 Chest pain, unspecified: Secondary | ICD-10-CM

## 2022-03-09 DIAGNOSIS — E7800 Pure hypercholesterolemia, unspecified: Secondary | ICD-10-CM

## 2022-03-09 DIAGNOSIS — I739 Peripheral vascular disease, unspecified: Secondary | ICD-10-CM | POA: Diagnosis not present

## 2022-03-09 DIAGNOSIS — I2510 Atherosclerotic heart disease of native coronary artery without angina pectoris: Secondary | ICD-10-CM

## 2022-03-09 MED ORDER — CLINDAMYCIN HCL 300 MG PO CAPS: 300.0000 mg | ORAL_CAPSULE | Freq: Three times a day (TID) | ORAL | 0 refills | Status: AC

## 2022-03-09 NOTE — Telephone Encounter (Signed)
Patient notified

## 2022-03-09 NOTE — Assessment & Plan Note (Signed)
Continue with atorvastatin 40 mg.  No myalgias. ?

## 2022-03-09 NOTE — Patient Instructions (Signed)
Medication Instructions:  ?Your physician recommends that you continue on your current medications as directed. Please refer to the Current Medication list given to you today. ? ?*If you need a refill on your cardiac medications before your next appointment, please call your pharmacy* ? ?Lab Work: ?If you have labs (blood work) drawn today and your tests are completely normal, you will receive your results only by: ?MyChart Message (if you have MyChart) OR ?A paper copy in the mail ?If you have any lab test that is abnormal or we need to change your treatment, we will call you to review the results. ? ?Testing/Procedures: ?Your physician has requested that you have a lexiscan myoview. For further information please visit [email or web address]. Please follow instruction sheet, as given. ? ?Follow-Up: ?At [facility], you and your health needs are our priority.  As part of our continuing mission to provide you with exceptional heart care, we have created designated Provider Care Teams.  These Care Teams include your primary Cardiologist (physician) and Advanced Practice Providers (APPs -  Physician Assistants and Nurse Practitioners) who all work together to provide you with the care you need, when you need it. ? ?We recommend signing up for the patient portal called "MyChart".  Sign up information is provided on this After Visit Summary.  MyChart is used to connect with patients for Virtual Visits (Telemedicine).  Patients are able to view lab/test results, encounter notes, upcoming appointments, etc.  Non-urgent messages can be sent to your provider as well.   ?To learn more about what you can do with MyChart, go to [email or web address].   ? ?Your next appointment:   ?6 month(s) ? ?The format for your next appointment:   ?In Person ? ?Provider:   ?Robbie Lis, PA-C, Nicholes Rough, PA-C, Melina Copa, PA-C, Ambrose Pancoast, NP, Cecilie Kicks, NP, Ermalinda Barrios, PA-C, Christen Bame, NP, Richardson Dopp, PA-C, or Margie Billet,  NP     Then, Candee Furbish, MD will plan to see you again in 1 year(s).{ ? ?

## 2022-03-09 NOTE — Assessment & Plan Note (Addendum)
CABG 2018 x 2 ?-Had myocardial infarction in 2000 with an RCA stent placed 5 x 18 MultiLink, then MI in 2002 with stent placed to posterior lateral branch 2.5 x 13 MultiLink. ?-Continue with high intensity statin atorvastatin 40 mg as well as carvedilol 12.5 mg.  Also on Plavix currently.  Recent stenting of right lower extremity by Dr. Stanford Breed. ?-We are going to get a release of medical records for his bypass details.  I would like to know where his 2 bypass grafts are. ?

## 2022-03-09 NOTE — Telephone Encounter (Signed)
Will send in a different antibiotics Thanks  ?

## 2022-03-09 NOTE — Assessment & Plan Note (Signed)
Dr. Stanford Breed on 02/25/2022 performed the following: ?1) US guided left common femoral artery access ?2) Aortogram ?3) Right lower extremity angiogram with third order cannulation (42m total contrast) ?4) Right superficial femoral artery angioplasty and stenting ([id number]) ?5) Right above knee popliteal artery angioplasty and stenting ([id number]) ?6) Right below knee popliteal / tibioperoneal angioplasty and stenting (4x22m29myx) ?

## 2022-03-11 ENCOUNTER — Other Ambulatory Visit: Payer: Self-pay | Admitting: *Deleted

## 2022-03-11 ENCOUNTER — Ambulatory Visit (INDEPENDENT_AMBULATORY_CARE_PROVIDER_SITE_OTHER): Payer: Self-pay | Admitting: Podiatry

## 2022-03-11 DIAGNOSIS — Z91199 Patient's noncompliance with other medical treatment and regimen due to unspecified reason: Secondary | ICD-10-CM

## 2022-03-11 DIAGNOSIS — I739 Peripheral vascular disease, unspecified: Secondary | ICD-10-CM

## 2022-03-11 NOTE — Progress Notes (Signed)
No show

## 2022-03-14 NOTE — H&P (View-Only) (Signed)
? ? ?ASSESSMENT & PLAN:  ?76 y.o. male with currently asymptomatic peripheral arterial disease s/p endovascular intervention in [place]. He now has ulceration about his right great toe. S/P femoropopliteal and TP trunk stenting by me 02/25/22. ? ?Recommend the following which can slow the progression of atherosclerosis and reduce the risk of major adverse cardiac / limb events:  ?Complete cessation from all tobacco products. ?Blood glucose control with goal A1c < 7%. ?Blood pressure control with goal blood pressure < 140/90 mmHg. ?Lipid reduction therapy with goal LDL-C <100 mg/dL (<70 if symptomatic from PAD).  ?Aspirin 81mg  PO QD.  ?Clopidogrel 75mg  PO QD. ?Atorvastatin 40-80mg  PO QD (or other "high intensity" statin therapy). ? ?Plan right partial right great toe amputation tomorrow in OR. ? ?CHIEF COMPLAINT:   ?Establish vascular care ? ?HISTORY:  ?HISTORY OF PRESENT ILLNESS: ?Corey Richards is a 76 y.o. male with history of DM, HF, HLA, HTN, AF, CAD s/p CABG. he has a strong history of peripheral arterial disease.  He is undergone multiple endovascular interventions in [place] near [place] with Dr Jaclyn Shaggy.  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  His right fifth toe is also surgically absent.  He is ambulatory.  He is a bit unsteady and ambulates with a cane.  He denies location, ischemic rest pain, ischemic ulceration. ? ?02/22/22: Patient returns to clinic. He has an ulcer about his right great toe. He reports some pain about the toe ulcer. No antecedent claudication. No ischemic rest pain.  ? ?03/15/22: No major issues from endovascular intervention.  The patient reports some mild pain in his right leg which slowly resolved over the course of the weekend after endovascular intervention.  He continues to undergo wound care to his toe.  Unfortunately he has some purulent drainage from his toe today. ? ?Past Medical History:  ?Diagnosis Date  ? Abnormal liver enzymes    ? Acute renal failure (ARF) (HCC)   ? Anemia   ? Benign prostatic hyperplasia   ? CHF (congestive heart failure) ([place])   ? Coronary arteriosclerosis   ? Diabetes mellitus without complication ([place])   ? Hyperlipidemia   ? Hypertension   ? Pain in joint of left hip   ? Pain in joint of right hip   ? Paroxysmal atrial fibrillation (HCC)   ? Peripheral edema   ? Peripheral vascular disease ([place])   ? 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 Sleep apnea   ? Stroke [facility])   ? ? ?Past Surgical History:  ?Procedure Laterality Date  ? ABDOMINAL AORTOGRAM W/LOWER EXTREMITY Right 02/25/2022  ? Procedure: ABDOMINAL AORTOGRAM W/LOWER EXTREMITY;  Surgeon: [name], MD;  Location: [place] [facility];  Service: Cardiovascular;  Laterality: Right;  ? cataract surgery    ? coronary artery bypass grafts N/A   ? peripheral angiography Bilateral   ? PERIPHERAL VASCULAR INTERVENTION Right 02/25/2022  ? Procedure: PERIPHERAL VASCULAR INTERVENTION;  Surgeon: [name], MD;  Location: [place] [facility];  Service: Cardiovascular;  Laterality: Right;  SFA/ Popliteal  ? ? ?Family History  ?Problem Relation Age of Onset  ? Alzheimer's disease Father   ? Heart attack Father   ? ? ?Social History  ? ?Socioeconomic History  ? Marital status: Married  ?  Spouse name: Not on file  ? Number of children: Not on file  ? Years of education: Not on file  ? Highest education level: Not  on file  ?Occupational History  ? Not on file  ?Tobacco Use  ? Smoking status: Former  ?  Types: Cigars  ?  Start date: 07/24/1969  ?  Quit date: 07/24/1981  ?  Years since quitting: 40.6  ? Smokeless tobacco: Never  ?Vaping Use  ? Vaping Use: Never used  ?Substance and Sexual Activity  ? Alcohol use: Never  ? Drug use: Not on file  ? Sexual activity: Yes  ?Other Topics Concern  ? Not on file  ?Social History Narrative  ? Not on file  ? ?Social Determinants of Health  ? ?Financial Resource Strain: Low Risk   ? Difficulty of Paying Living  Expenses: Not hard at all  ?Food Insecurity: No Food Insecurity  ? Worried About Charity fundraiser in the Last Year: Never true  ? Ran Out of Food in the Last Year: Never true  ?Transportation Needs: No Transportation Needs  ? Lack of Transportation (Medical): No  ? Lack of Transportation (Non-Medical): No  ?Physical Activity: Inactive  ? Days of Exercise per Week: 0 days  ? Minutes of Exercise per Session: 0 min  ?Stress: No Stress Concern Present  ? Feeling of Stress : Not at all  ?Social Connections: Moderately Integrated  ? Frequency of Communication with Friends and Family: Twice a week  ? Frequency of Social Gatherings with Friends and Family: Twice a week  ? Attends Religious Services: 1 to 4 times per year  ? Active Member of Clubs or Organizations: No  ? Attends Archivist Meetings: Never  ? Marital Status: Married  ?Intimate Partner Violence: Not At Risk  ? Fear of Current or Ex-Partner: No  ? Emotionally Abused: No  ? Physically Abused: No  ? Sexually Abused: No  ? ? ?No Known Allergies ? ?Current Outpatient Medications  ?Medication Sig Dispense Refill  ? acetaminophen (TYLENOL) 500 MG tablet Take 1,500 mg by mouth daily.    ? atorvastatin (LIPITOR) 40 MG tablet Take 1 tablet (40 mg total) by mouth at bedtime. 90 tablet 3  ? calcium carbonate (TUMS - DOSED IN MG ELEMENTAL CALCIUM) 500 MG chewable tablet Chew 1-2 tablets by mouth daily as needed for indigestion or heartburn.    ? carvedilol (COREG) 12.5 MG tablet TAKE 1 TABLET BY MOUTH TWICE  DAILY WITH A MEAL 180 tablet 3  ? clindamycin (CLEOCIN) 300 MG capsule Take 1 capsule (300 mg total) by mouth 3 (three) times daily for 7 days. 21 capsule 0  ? clopidogrel (PLAVIX) 75 MG tablet TAKE 1 TABLET BY MOUTH DAILY 90 tablet 1  ? empagliflozin (JARDIANCE) 10 MG TABS tablet Take 1 tablet (10 mg total) by mouth daily. 90 tablet 3  ? furosemide (LASIX) 40 MG tablet TAKE 1 TABLET BY MOUTH  DAILY CAN TAKE EXTRA TABLET DAILY AS NEEDED FOR EDEMA 180  tablet 3  ? gabapentin (NEURONTIN) 300 MG capsule Take 300 mg by mouth at bedtime as needed (pain).    ? HUMALOG KWIKPEN 100 UNIT/ML KwikPen Inject 8-18 Units into the skin 4 (four) times daily as needed (high blood sugar).    ? primidone (MYSOLINE) 50 MG tablet Take 100 mg by mouth 2 (two) times daily after a meal.    ? SODIUM BICARBONATE PO Take 648 mg by mouth 4 (four) times daily.    ? sulfamethoxazole-trimethoprim (BACTRIM DS) 800-160 MG tablet Take 1 tablet by mouth 2 (two) times daily for 7 days. 14 tablet 0  ? terazosin (HYTRIN) 10  MG capsule TAKE 1 CAPSULE BY MOUTH DAILY  ALONG WITH 5 MG CAPSULE 90 capsule 3  ? terazosin (HYTRIN) 5 MG capsule TAKE 1 CAPSULE BY MOUTH DAILY  ALONG WITH 10 MG CAPSULE 90 capsule 3  ? Vitamin D, Cholecalciferol, 25 MCG (1000 UT) CAPS Take 1,000 Units by mouth daily.    ? ?No current facility-administered medications for this visit.  ? ? ?REVIEW OF SYSTEMS:  ?$RemoveBe'[X]'igrNyyrDK$  denotes positive finding, $RemoveBeforeDEI'[ ]'CjqaLHnEdupfNCxi$  denotes negative finding ?Cardiac  Comments:  ?Chest pain or chest pressure:    ?Shortness of breath upon exertion:    ?Short of breath when lying flat:    ?Irregular heart rhythm:    ?    ?Vascular    ?Pain in calf, thigh, or hip brought on by ambulation:    ?Pain in feet at night that wakes you up from your sleep:     ?Blood clot in your veins:    ?Leg swelling:     ?    ?Pulmonary    ?Oxygen at home:    ?Productive cough:     ?Wheezing:     ?    ?Neurologic    ?Sudden weakness in arms or legs:     ?Sudden numbness in arms or legs:     ?Sudden onset of difficulty speaking or slurred speech:    ?Temporary loss of vision in one eye:     ?Problems with dizziness:     ?    ?Gastrointestinal    ?Blood in stool:     ?Vomited blood:     ?    ?Genitourinary    ?Burning when urinating:     ?Blood in urine:    ?    ?Psychiatric    ?Major depression:     ?    ?Hematologic    ?Bleeding problems:    ?Problems with blood clotting too easily:    ?    ?Skin    ?Rashes or ulcers:    ?    ?Constitutional     ?Fever or chills:    ? ?PHYSICAL EXAM:  ? ?Vitals:  ? 03/15/22 1145  ?BP: (!) 156/74  ?Pulse: 72  ?Resp: 20  ?Temp: 98.4 ?F (36.9 ?C)  ?SpO2: 98%  ?Weight: 245 lb (111.1 kg)  ?Height: $RemoveB'6\' 1"'ZxFszUtP$  (1.854 m)  ? ? ? ?Cons

## 2022-03-14 NOTE — Progress Notes (Signed)
? ? ?ASSESSMENT & PLAN:  ?76 y.o. male with currently asymptomatic peripheral arterial disease s/p endovascular intervention in [place]. He now has ulceration about his right great toe. S/P femoropopliteal and TP trunk stenting by me 02/25/22. ? ?Recommend the following which can slow the progression of atherosclerosis and reduce the risk of major adverse cardiac / limb events:  ?Complete cessation from all tobacco products. ?Blood glucose control with goal A1c < 7%. ?Blood pressure control with goal blood pressure < 140/90 mmHg. ?Lipid reduction therapy with goal LDL-C <100 mg/dL (<70 if symptomatic from PAD).  ?Aspirin 81mg  PO QD.  ?Clopidogrel 75mg  PO QD. ?Atorvastatin 40-80mg  PO QD (or other "high intensity" statin therapy). ? ?Plan right partial right great toe amputation tomorrow in OR. ? ?CHIEF COMPLAINT:   ?Establish vascular care ? ?HISTORY:  ?HISTORY OF PRESENT ILLNESS: ?Corey Richards is a 75 y.o. male with history of DM, HF, HLA, HTN, AF, CAD s/p CABG. he has a strong history of peripheral arterial disease.  He is undergone multiple endovascular interventions in [place] near [place] with Dr Jaclyn Shaggy.  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  His right fifth toe is also surgically absent.  He is ambulatory.  He is a bit unsteady and ambulates with a cane.  He denies location, ischemic rest pain, ischemic ulceration. ? ?02/22/22: Patient returns to clinic. He has an ulcer about his right great toe. He reports some pain about the toe ulcer. No antecedent claudication. No ischemic rest pain.  ? ?03/15/22: No major issues from endovascular intervention.  The patient reports some mild pain in his right leg which slowly resolved over the course of the weekend after endovascular intervention.  He continues to undergo wound care to his toe.  Unfortunately he has some purulent drainage from his toe today. ? ?Past Medical History:  ?Diagnosis Date  ? Abnormal liver enzymes    ? Acute renal failure (ARF) (HCC)   ? Anemia   ? Benign prostatic hyperplasia   ? CHF (congestive heart failure) ([place])   ? Coronary arteriosclerosis   ? Diabetes mellitus without complication ([place])   ? Hyperlipidemia   ? Hypertension   ? Pain in joint of left hip   ? Pain in joint of right hip   ? Paroxysmal atrial fibrillation (HCC)   ? Peripheral edema   ? Peripheral vascular disease ([place])   ? 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 Sleep apnea   ? Stroke [facility])   ? ? ?Past Surgical History:  ?Procedure Laterality Date  ? ABDOMINAL AORTOGRAM W/LOWER EXTREMITY Right 02/25/2022  ? Procedure: ABDOMINAL AORTOGRAM W/LOWER EXTREMITY;  Surgeon: [name], MD;  Location: [facility];  Service: Cardiovascular;  Laterality: Right;  ? cataract surgery    ? coronary artery bypass grafts N/A   ? peripheral angiography Bilateral   ? PERIPHERAL VASCULAR INTERVENTION Right 02/25/2022  ? Procedure: PERIPHERAL VASCULAR INTERVENTION;  Surgeon: [name], MD;  Location: [facility];  Service: Cardiovascular;  Laterality: Right;  SFA/ Popliteal  ? ? ?Family History  ?Problem Relation Age of Onset  ? Alzheimer's disease Father   ? Heart attack Father   ? ? ?Social History  ? ?Socioeconomic History  ? Marital status: Married  ?  Spouse name: Not on file  ? Number of children: Not on file  ? Years of education: Not on file  ? Highest education level: Not  on file  ?Occupational History  ? Not on file  ?Tobacco Use  ? Smoking status: Former  ?  Types: Cigars  ?  Start date: 07/24/1969  ?  Quit date: 07/24/1981  ?  Years since quitting: 40.6  ? Smokeless tobacco: Never  ?Vaping Use  ? Vaping Use: Never used  ?Substance and Sexual Activity  ? Alcohol use: Never  ? Drug use: Not on file  ? Sexual activity: Yes  ?Other Topics Concern  ? Not on file  ?Social History Narrative  ? Not on file  ? ?Social Determinants of Health  ? ?Financial Resource Strain: Low Risk   ? Difficulty of Paying Living  Expenses: Not hard at all  ?Food Insecurity: No Food Insecurity  ? Worried About Charity fundraiser in the Last Year: Never true  ? Ran Out of Food in the Last Year: Never true  ?Transportation Needs: No Transportation Needs  ? Lack of Transportation (Medical): No  ? Lack of Transportation (Non-Medical): No  ?Physical Activity: Inactive  ? Days of Exercise per Week: 0 days  ? Minutes of Exercise per Session: 0 min  ?Stress: No Stress Concern Present  ? Feeling of Stress : Not at all  ?Social Connections: Moderately Integrated  ? Frequency of Communication with Friends and Family: Twice a week  ? Frequency of Social Gatherings with Friends and Family: Twice a week  ? Attends Religious Services: 1 to 4 times per year  ? Active Member of Clubs or Organizations: No  ? Attends Archivist Meetings: Never  ? Marital Status: Married  ?Intimate Partner Violence: Not At Risk  ? Fear of Current or Ex-Partner: No  ? Emotionally Abused: No  ? Physically Abused: No  ? Sexually Abused: No  ? ? ?No Known Allergies ? ?Current Outpatient Medications  ?Medication Sig Dispense Refill  ? acetaminophen (TYLENOL) 500 MG tablet Take 1,500 mg by mouth daily.    ? atorvastatin (LIPITOR) 40 MG tablet Take 1 tablet (40 mg total) by mouth at bedtime. 90 tablet 3  ? calcium carbonate (TUMS - DOSED IN MG ELEMENTAL CALCIUM) 500 MG chewable tablet Chew 1-2 tablets by mouth daily as needed for indigestion or heartburn.    ? carvedilol (COREG) 12.5 MG tablet TAKE 1 TABLET BY MOUTH TWICE  DAILY WITH A MEAL 180 tablet 3  ? clindamycin (CLEOCIN) 300 MG capsule Take 1 capsule (300 mg total) by mouth 3 (three) times daily for 7 days. 21 capsule 0  ? clopidogrel (PLAVIX) 75 MG tablet TAKE 1 TABLET BY MOUTH DAILY 90 tablet 1  ? empagliflozin (JARDIANCE) 10 MG TABS tablet Take 1 tablet (10 mg total) by mouth daily. 90 tablet 3  ? furosemide (LASIX) 40 MG tablet TAKE 1 TABLET BY MOUTH  DAILY CAN TAKE EXTRA TABLET DAILY AS NEEDED FOR EDEMA 180  tablet 3  ? gabapentin (NEURONTIN) 300 MG capsule Take 300 mg by mouth at bedtime as needed (pain).    ? HUMALOG KWIKPEN 100 UNIT/ML KwikPen Inject 8-18 Units into the skin 4 (four) times daily as needed (high blood sugar).    ? primidone (MYSOLINE) 50 MG tablet Take 100 mg by mouth 2 (two) times daily after a meal.    ? SODIUM BICARBONATE PO Take 648 mg by mouth 4 (four) times daily.    ? sulfamethoxazole-trimethoprim (BACTRIM DS) 800-160 MG tablet Take 1 tablet by mouth 2 (two) times daily for 7 days. 14 tablet 0  ? terazosin (HYTRIN) 10  MG capsule TAKE 1 CAPSULE BY MOUTH DAILY  ALONG WITH 5 MG CAPSULE 90 capsule 3  ? terazosin (HYTRIN) 5 MG capsule TAKE 1 CAPSULE BY MOUTH DAILY  ALONG WITH 10 MG CAPSULE 90 capsule 3  ? Vitamin D, Cholecalciferol, 25 MCG (1000 UT) CAPS Take 1,000 Units by mouth daily.    ? ?No current facility-administered medications for this visit.  ? ? ?REVIEW OF SYSTEMS:  ?$RemoveBe'[X]'kbxWDhMeX$  denotes positive finding, $RemoveBeforeDEI'[ ]'xeuOmsBuqElngyAK$  denotes negative finding ?Cardiac  Comments:  ?Chest pain or chest pressure:    ?Shortness of breath upon exertion:    ?Short of breath when lying flat:    ?Irregular heart rhythm:    ?    ?Vascular    ?Pain in calf, thigh, or hip brought on by ambulation:    ?Pain in feet at night that wakes you up from your sleep:     ?Blood clot in your veins:    ?Leg swelling:     ?    ?Pulmonary    ?Oxygen at home:    ?Productive cough:     ?Wheezing:     ?    ?Neurologic    ?Sudden weakness in arms or legs:     ?Sudden numbness in arms or legs:     ?Sudden onset of difficulty speaking or slurred speech:    ?Temporary loss of vision in one eye:     ?Problems with dizziness:     ?    ?Gastrointestinal    ?Blood in stool:     ?Vomited blood:     ?    ?Genitourinary    ?Burning when urinating:     ?Blood in urine:    ?    ?Psychiatric    ?Major depression:     ?    ?Hematologic    ?Bleeding problems:    ?Problems with blood clotting too easily:    ?    ?Skin    ?Rashes or ulcers:    ?    ?Constitutional     ?Fever or chills:    ? ?PHYSICAL EXAM:  ? ?Vitals:  ? 03/15/22 1145  ?BP: (!) 156/74  ?Pulse: 72  ?Resp: 20  ?Temp: 98.4 ?F (36.9 ?C)  ?SpO2: 98%  ?Weight: 245 lb (111.1 kg)  ?Height: $RemoveB'6\' 1"'CsClXiIe$  (1.854 m)  ? ? ? ?Cons

## 2022-03-15 ENCOUNTER — Encounter (HOSPITAL_COMMUNITY): Payer: Self-pay | Admitting: Vascular Surgery

## 2022-03-15 ENCOUNTER — Encounter: Payer: Self-pay | Admitting: Vascular Surgery

## 2022-03-15 ENCOUNTER — Other Ambulatory Visit: Payer: Self-pay

## 2022-03-15 ENCOUNTER — Ambulatory Visit (HOSPITAL_COMMUNITY)
Admission: RE | Admit: 2022-03-15 | Discharge: 2022-03-15 | Disposition: A | Discharge status: Home / Self Care | Payer: Medicare Other | Source: Ambulatory Visit | Attending: Vascular Surgery | Admitting: Vascular Surgery

## 2022-03-15 ENCOUNTER — Ambulatory Visit (INDEPENDENT_AMBULATORY_CARE_PROVIDER_SITE_OTHER): Payer: Medicare Other | Admitting: Vascular Surgery

## 2022-03-15 VITALS — BP 156/74 | HR 72 | Temp 98.4°F | Resp 20 | Ht 73.0 in | Wt 245.0 lb

## 2022-03-15 DIAGNOSIS — I70235 Atherosclerosis of native arteries of right leg with ulceration of other part of foot: Secondary | ICD-10-CM

## 2022-03-15 DIAGNOSIS — I739 Peripheral vascular disease, unspecified: Secondary | ICD-10-CM | POA: Insufficient documentation

## 2022-03-15 NOTE — Progress Notes (Addendum)
Attempted to contact pt, no answer.  ? ?Pre op instructions LVM.  ? ?Anesthesia review: yes ? ?------------- ? ?SDW INSTRUCTIONS: ? ?Your procedure is scheduled on 4/5. Please report to [facility] Main Entrance "A" at 10:30 A.M., and check in at the Admitting office. Call this number if you have problems the morning of surgery: [phone] ? ? ?Remember: Do not eat after midnight the night before your surgery ? ?You may drink clear liquids until 10:00 AM the morning of your surgery.   ?Clear liquids allowed are: Water, Non-Citrus Juices (without pulp), Carbonated Beverages, Clear Tea, Black Coffee Only, and Gatorade ?  ?Medications to take morning of surgery with a sip of water include: ?acetaminophen (TYLENOL)  ?carvedilol (COREG)  ?clindamycin (CLEOCIN)  ?primidone (MYSOLINE)  ? ?** PLEASE check your blood sugar the morning of your surgery when you wake up and every 2 hours until you get to the Short Stay unit. ? ?If your blood sugar is less than 70 mg/dL, you will need to treat for low blood sugar: ?Do not take insulin. ?Treat a low blood sugar (less than 70 mg/dL) with ? cup of clear juice (cranberry or apple), 4 glucose tablets, OR glucose gel. ?Recheck blood sugar in 15 minutes after treatment (to make sure it is greater than 70 mg/dL). If your blood sugar is not greater than 70 mg/dL on recheck, call [phone] for further instructions. ? ?If CBG >220 please take half normal dose of sliding scale insulin - HUMALOG KWIKPEN  ? ?As of today, STOP taking any Aspirin (unless otherwise instructed by your surgeon), Aleve, Naproxen, Ibuprofen, Motrin, Advil, Goody's, BC's, all herbal medications, fish oil, and all vitamins. ? ?Follow your surgeon's instructions on when to stop clopidogrel (PLAVIX)  If no instructions were given by your surgeon then you will need to call the office to get those instructions.   ? ?  ?The Morning of Surgery ?Do not wear jewelry ?Do not wear lotions, powders, colognes, or  deodorant ?Do not bring valuables to the hospital. ?[facility] is not responsible for any belongings or valuables. ? ?If you are a smoker, DO NOT Smoke 24 hours prior to surgery ? ?If you wear a CPAP at night please bring your mask the morning of surgery  ? ?Remember that you must have someone to transport you home after your surgery, and remain with you for 24 hours if you are discharged the same day. ? ?Please bring cases for contacts, glasses, hearing aids, dentures or bridgework because it cannot be worn into surgery.  ? ?Patients discharged the day of surgery will not be allowed to drive home.  ? ?Please shower the NIGHT BEFORE/MORNING OF SURGERY (use antibacterial soap like DIAL soap if possible). Wear comfortable clothes the morning of surgery. Oral Hygiene is also important to reduce your risk of infection.  Remember - BRUSH YOUR TEETH THE MORNING OF SURGERY WITH YOUR REGULAR TOOTHPASTE ? ?Patient denies shortness of breath, fever, cough and chest pain.  ? ? ?   ? ?

## 2022-03-15 NOTE — Progress Notes (Signed)
Pt just returned call, instructions from previous note repeated directly to pt again.  ?

## 2022-03-16 ENCOUNTER — Other Ambulatory Visit: Payer: Self-pay

## 2022-03-16 ENCOUNTER — Encounter (HOSPITAL_COMMUNITY)
Admission: RE | Disposition: A | Discharge status: Home / Self Care | Payer: Self-pay | Source: Home / Self Care | Attending: Vascular Surgery

## 2022-03-16 ENCOUNTER — Ambulatory Visit (HOSPITAL_COMMUNITY): Payer: Medicare Other | Admitting: Certified Registered Nurse Anesthetist

## 2022-03-16 ENCOUNTER — Encounter (HOSPITAL_COMMUNITY): Payer: Self-pay | Admitting: *Deleted

## 2022-03-16 ENCOUNTER — Emergency Department (HOSPITAL_BASED_OUTPATIENT_CLINIC_OR_DEPARTMENT_OTHER)
Admission: EM | Admit: 2022-03-16 | Discharge: 2022-03-16 | Disposition: A | Discharge status: Home / Self Care | Payer: Medicare Other | Source: Home / Self Care | Attending: Emergency Medicine | Admitting: Emergency Medicine

## 2022-03-16 ENCOUNTER — Encounter (HOSPITAL_BASED_OUTPATIENT_CLINIC_OR_DEPARTMENT_OTHER): Payer: Self-pay

## 2022-03-16 ENCOUNTER — Ambulatory Visit (HOSPITAL_BASED_OUTPATIENT_CLINIC_OR_DEPARTMENT_OTHER): Payer: Medicare Other | Admitting: Certified Registered Nurse Anesthetist

## 2022-03-16 ENCOUNTER — Telehealth (HOSPITAL_COMMUNITY): Payer: Self-pay | Admitting: *Deleted

## 2022-03-16 ENCOUNTER — Encounter (HOSPITAL_COMMUNITY): Payer: Self-pay | Admitting: Vascular Surgery

## 2022-03-16 ENCOUNTER — Ambulatory Visit (HOSPITAL_COMMUNITY)
Admission: RE | Admit: 2022-03-16 | Discharge: 2022-03-16 | Disposition: A | Discharge status: Home / Self Care | Payer: Medicare Other | Attending: Vascular Surgery | Admitting: Vascular Surgery

## 2022-03-16 DIAGNOSIS — E11621 Type 2 diabetes mellitus with foot ulcer: Secondary | ICD-10-CM | POA: Insufficient documentation

## 2022-03-16 DIAGNOSIS — Z4801 Encounter for change or removal of surgical wound dressing: Secondary | ICD-10-CM | POA: Diagnosis not present

## 2022-03-16 DIAGNOSIS — Z7982 Long term (current) use of aspirin: Secondary | ICD-10-CM | POA: Insufficient documentation

## 2022-03-16 DIAGNOSIS — Z794 Long term (current) use of insulin: Secondary | ICD-10-CM | POA: Insufficient documentation

## 2022-03-16 DIAGNOSIS — I509 Heart failure, unspecified: Secondary | ICD-10-CM | POA: Diagnosis not present

## 2022-03-16 DIAGNOSIS — L97519 Non-pressure chronic ulcer of other part of right foot with unspecified severity: Secondary | ICD-10-CM | POA: Diagnosis not present

## 2022-03-16 DIAGNOSIS — Z8673 Personal history of transient ischemic attack (TIA), and cerebral infarction without residual deficits: Secondary | ICD-10-CM | POA: Diagnosis not present

## 2022-03-16 DIAGNOSIS — Z89412 Acquired absence of left great toe: Secondary | ICD-10-CM | POA: Insufficient documentation

## 2022-03-16 DIAGNOSIS — I739 Peripheral vascular disease, unspecified: Secondary | ICD-10-CM

## 2022-03-16 DIAGNOSIS — E1151 Type 2 diabetes mellitus with diabetic peripheral angiopathy without gangrene: Secondary | ICD-10-CM | POA: Diagnosis not present

## 2022-03-16 DIAGNOSIS — Z87891 Personal history of nicotine dependence: Secondary | ICD-10-CM | POA: Insufficient documentation

## 2022-03-16 DIAGNOSIS — Z89421 Acquired absence of other right toe(s): Secondary | ICD-10-CM | POA: Insufficient documentation

## 2022-03-16 DIAGNOSIS — R58 Hemorrhage, not elsewhere classified: Secondary | ICD-10-CM

## 2022-03-16 DIAGNOSIS — M86671 Other chronic osteomyelitis, right ankle and foot: Secondary | ICD-10-CM | POA: Diagnosis not present

## 2022-03-16 DIAGNOSIS — I110 Hypertensive heart disease with heart failure: Secondary | ICD-10-CM

## 2022-03-16 DIAGNOSIS — Z951 Presence of aortocoronary bypass graft: Secondary | ICD-10-CM | POA: Diagnosis not present

## 2022-03-16 DIAGNOSIS — M869 Osteomyelitis, unspecified: Secondary | ICD-10-CM | POA: Diagnosis not present

## 2022-03-16 DIAGNOSIS — L7622 Postprocedural hemorrhage and hematoma of skin and subcutaneous tissue following other procedure: Secondary | ICD-10-CM | POA: Insufficient documentation

## 2022-03-16 DIAGNOSIS — I2510 Atherosclerotic heart disease of native coronary artery without angina pectoris: Secondary | ICD-10-CM | POA: Diagnosis not present

## 2022-03-16 DIAGNOSIS — Z79899 Other long term (current) drug therapy: Secondary | ICD-10-CM | POA: Insufficient documentation

## 2022-03-16 DIAGNOSIS — Z5189 Encounter for other specified aftercare: Secondary | ICD-10-CM

## 2022-03-16 DIAGNOSIS — E1169 Type 2 diabetes mellitus with other specified complication: Secondary | ICD-10-CM | POA: Diagnosis not present

## 2022-03-16 DIAGNOSIS — Z89422 Acquired absence of other left toe(s): Secondary | ICD-10-CM | POA: Diagnosis not present

## 2022-03-16 DIAGNOSIS — Z4800 Encounter for change or removal of nonsurgical wound dressing: Secondary | ICD-10-CM | POA: Insufficient documentation

## 2022-03-16 DIAGNOSIS — Z7902 Long term (current) use of antithrombotics/antiplatelets: Secondary | ICD-10-CM | POA: Insufficient documentation

## 2022-03-16 DIAGNOSIS — M86171 Other acute osteomyelitis, right ankle and foot: Secondary | ICD-10-CM | POA: Diagnosis present

## 2022-03-16 HISTORY — PX: AMPUTATION: SHX166

## 2022-03-16 LAB — POCT I-STAT, CHEM 8
BUN: 90 mg/dL — ABNORMAL HIGH (ref 8–23)
Calcium, Ion: 0.89 mmol/L — CL (ref 1.15–1.40)
Chloride: 101 mmol/L (ref 98–111)
Creatinine, Ser: 2.9 mg/dL — ABNORMAL HIGH (ref 0.61–1.24)
Glucose, Bld: 162 mg/dL — ABNORMAL HIGH (ref 70–99)
HCT: 40 % (ref 39.0–52.0)
Hemoglobin: 13.6 g/dL (ref 13.0–17.0)
Potassium: 4.7 mmol/L (ref 3.5–5.1)
Sodium: 134 mmol/L — ABNORMAL LOW (ref 135–145)
TCO2: 29 mmol/L (ref 22–32)

## 2022-03-16 LAB — GLUCOSE, CAPILLARY
Glucose-Capillary: 165 mg/dL — ABNORMAL HIGH (ref 70–99)
Glucose-Capillary: 159 mg/dL — ABNORMAL HIGH (ref 70–99)
Glucose-Capillary: 185 mg/dL — ABNORMAL HIGH (ref 70–99)

## 2022-03-16 SURGERY — AMPUTATION DIGIT
Anesthesia: Monitor Anesthesia Care | Site: Foot | Laterality: Right

## 2022-03-16 MED ORDER — ONDANSETRON HCL 4 MG/2ML IJ SOLN
INTRAMUSCULAR | Status: DC | PRN
  Administered 2022-03-16: 4 mg via INTRAVENOUS

## 2022-03-16 MED ORDER — SODIUM CHLORIDE 0.9 % IV SOLN: INTRAVENOUS | Status: DC

## 2022-03-16 MED ORDER — ONDANSETRON HCL 4 MG/2ML IJ SOLN: 4.0000 mg | Freq: Four times a day (QID) | INTRAMUSCULAR | Status: DC | PRN

## 2022-03-16 MED ORDER — PROPOFOL 500 MG/50ML IV EMUL
INTRAVENOUS | Status: DC | PRN
  Administered 2022-03-16: 50 ug/kg/min via INTRAVENOUS

## 2022-03-16 MED ORDER — FENTANYL CITRATE (PF) 100 MCG/2ML IJ SOLN: 25.0000 ug | INTRAMUSCULAR | Status: DC | PRN

## 2022-03-16 MED ORDER — FENTANYL CITRATE (PF) 100 MCG/2ML IJ SOLN: 50.0000 ug | Freq: Once | INTRAMUSCULAR | Status: AC

## 2022-03-16 MED ORDER — CHLORHEXIDINE GLUCONATE 0.12 % MT SOLN
15.0000 mL | Freq: Once | OROMUCOSAL | Status: AC
  Administered 2022-03-16: 15 mL via OROMUCOSAL
  Filled 2022-03-16: qty 15

## 2022-03-16 MED ORDER — OXYCODONE HCL 5 MG/5ML PO SOLN: 5.0000 mg | Freq: Once | ORAL | Status: DC | PRN

## 2022-03-16 MED ORDER — ROPIVACAINE HCL 5 MG/ML IJ SOLN
INTRAMUSCULAR | Status: DC | PRN
  Administered 2022-03-16: 30 mL via PERINEURAL

## 2022-03-16 MED ORDER — ORAL CARE MOUTH RINSE: 15.0000 mL | Freq: Once | OROMUCOSAL | Status: AC

## 2022-03-16 MED ORDER — FENTANYL CITRATE (PF) 100 MCG/2ML IJ SOLN
INTRAMUSCULAR | Status: AC
  Administered 2022-03-16: 50 ug via INTRAVENOUS
  Filled 2022-03-16: qty 2

## 2022-03-16 MED ORDER — INSULIN ASPART 100 UNIT/ML IJ SOLN: 0.0000 [IU] | INTRAMUSCULAR | Status: DC | PRN

## 2022-03-16 MED ORDER — CEFAZOLIN SODIUM-DEXTROSE 2-4 GM/100ML-% IV SOLN
2.0000 g | INTRAVENOUS | Status: AC
  Administered 2022-03-16: 2 g via INTRAVENOUS
  Filled 2022-03-16: qty 100

## 2022-03-16 MED ORDER — CHLORHEXIDINE GLUCONATE 4 % EX LIQD: 60.0000 mL | Freq: Once | CUTANEOUS | Status: DC

## 2022-03-16 MED ORDER — OXYCODONE-ACETAMINOPHEN 5-325 MG PO TABS: 1.0000 | ORAL_TABLET | Freq: Four times a day (QID) | ORAL | 0 refills | Status: AC | PRN

## 2022-03-16 MED ORDER — MIDAZOLAM HCL 2 MG/2ML IJ SOLN
INTRAMUSCULAR | Status: AC
  Filled 2022-03-16: qty 2

## 2022-03-16 MED ORDER — OXYCODONE HCL 5 MG PO TABS: 5.0000 mg | ORAL_TABLET | Freq: Once | ORAL | Status: DC | PRN

## 2022-03-16 MED ORDER — 0.9 % SODIUM CHLORIDE (POUR BTL) OPTIME
TOPICAL | Status: DC | PRN
  Administered 2022-03-16: 1000 mL

## 2022-03-16 SURGICAL SUPPLY — 35 items
BAG COUNTER SPONGE SURGICOUNT (BAG) ×2 IMPLANT
BAG SPNG CNTER NS LX DISP (BAG) ×1
BLADE AVERAGE 25X9 (BLADE) IMPLANT
BLADE SURG 21 STRL SS (BLADE) ×2 IMPLANT
BNDG CONFORM 3 STRL LF (GAUZE/BANDAGES/DRESSINGS) ×2 IMPLANT
BNDG ELASTIC 4X5.8 VLCR STR LF (GAUZE/BANDAGES/DRESSINGS) ×2 IMPLANT
BNDG GAUZE ELAST 4 BULKY (GAUZE/BANDAGES/DRESSINGS) ×2 IMPLANT
CANISTER SUCT 3000ML PPV (MISCELLANEOUS) ×2 IMPLANT
COVER SURGICAL LIGHT HANDLE (MISCELLANEOUS) ×2 IMPLANT
DRAPE HALF SHEET 40X57 (DRAPES) ×3 IMPLANT
DRAPE ORTHO SPLIT 77X108 STRL (DRAPES) ×2
DRAPE SURG ORHT 6 SPLT 77X108 (DRAPES) ×2 IMPLANT
ELECT REM PT RETURN 9FT ADLT (ELECTROSURGICAL) ×2
ELECTRODE REM PT RTRN 9FT ADLT (ELECTROSURGICAL) ×1 IMPLANT
GAUZE SPONGE 4X4 12PLY STRL (GAUZE/BANDAGES/DRESSINGS) ×2 IMPLANT
GAUZE XEROFORM 1X8 LF (GAUZE/BANDAGES/DRESSINGS) ×2 IMPLANT
GAUZE XEROFORM 5X9 LF (GAUZE/BANDAGES/DRESSINGS) ×1 IMPLANT
GLOVE SURG POLYISO LF SZ8 (GLOVE) ×2 IMPLANT
GOWN STRL REUS W/ TWL LRG LVL3 (GOWN DISPOSABLE) ×2 IMPLANT
GOWN STRL REUS W/ TWL XL LVL3 (GOWN DISPOSABLE) ×1 IMPLANT
GOWN STRL REUS W/TWL LRG LVL3 (GOWN DISPOSABLE) ×4
GOWN STRL REUS W/TWL XL LVL3 (GOWN DISPOSABLE) ×2
KIT BASIN OR (CUSTOM PROCEDURE TRAY) ×2 IMPLANT
KIT TURNOVER KIT B (KITS) ×2 IMPLANT
NS IRRIG 1000ML POUR BTL (IV SOLUTION) ×2 IMPLANT
PACK GENERAL/GYN (CUSTOM PROCEDURE TRAY) ×2 IMPLANT
PAD ARMBOARD 7.5X6 YLW CONV (MISCELLANEOUS) ×4 IMPLANT
SUT ETHILON 2 0 PSLX (SUTURE) ×2 IMPLANT
SUT ETHILON 3 0 PS 1 (SUTURE) ×1 IMPLANT
SUT VIC AB 3-0 SH 27 (SUTURE) ×2
SUT VIC AB 3-0 SH 27X BRD (SUTURE) IMPLANT
TOWEL GREEN STERILE (TOWEL DISPOSABLE) ×4 IMPLANT
TOWEL GREEN STERILE FF (TOWEL DISPOSABLE) ×2 IMPLANT
UNDERPAD 30X36 HEAVY ABSORB (UNDERPADS AND DIAPERS) ×2 IMPLANT
WATER STERILE IRR 1000ML POUR (IV SOLUTION) ×2 IMPLANT

## 2022-03-16 NOTE — Telephone Encounter (Signed)
Called patient with instructions for upcoming appointment- letter sent via my chart per patient request.  Corey Richards ? ?

## 2022-03-16 NOTE — ED Notes (Signed)
Dressing changed after quick clot applied. Gauze reinforced and ace wrap compression per provider order. ?

## 2022-03-16 NOTE — Anesthesia Procedure Notes (Signed)
Anesthesia Regional Block: Popliteal block  ? ?Pre-Anesthetic Checklist: , timeout performed,  Correct Patient, Correct Site, Correct Laterality,  Correct Procedure, Correct Position, site marked,  Risks and benefits discussed,  Surgical consent,  Pre-op evaluation,  At surgeon's request and post-op pain management ? ?Laterality: Right ? ?Prep: chloraprep     ?  ?Needles:  ?Injection technique: Single-shot ? ?Needle Type: Echogenic Stimulator Needle   ? ? ? ? ? ? ? ?Additional Needles: ? ? ?Procedures:, nerve stimulator,,,,,    ? ?Nerve Stimulator or Paresthesia:  ?Response: plantar flexion of foot, 0.45 mA ? ?Additional Responses:  ? ?Narrative:  ?Start time: 03/16/2022 12:25 PM ?End time: 03/16/2022 12:35 PM ?Injection made incrementally with aspirations every 5 mL. ? ?Performed by: Personally  ?Anesthesiologist: Albertha Ghee, MD ? ?Additional Notes: ?Functioning IV was confirmed and monitors were applied.  A 43m 21ga Arrow echogenic stimulator needle was used. Sterile prep and drape,hand hygiene and sterile gloves were used.  Negative aspiration and negative test dose prior to incremental administration of local anesthetic. The patient tolerated the procedure well. ? Ultrasound guidance: relevent anatomy identified, needle position confirmed, local anesthetic spread visualized around nerve(s), vascular puncture avoided.  Image printed for medical record.  ? ? ? ? ?

## 2022-03-16 NOTE — Anesthesia Preprocedure Evaluation (Signed)
Anesthesia Evaluation  ?Patient identified by MRN, date of birth, ID band ?Patient awake ? ? ? ?Reviewed: ?Allergy & Precautions, H&P , NPO status , Patient's Chart, lab work & pertinent test results ? ?Airway ?Mallampati: II ? ? ?Neck ROM: full ? ? ? Dental ?  ?Pulmonary ?sleep apnea , former smoker,  ?  ?breath sounds clear to auscultation ? ? ? ? ? ? Cardiovascular ?hypertension, + CAD, + CABG, + Peripheral Vascular Disease and +CHF  ?+ dysrhythmias Atrial Fibrillation  ?Rhythm:regular Rate:Normal ? ? ?  ?Neuro/Psych ?CVA   ? GI/Hepatic ?  ?Endo/Other  ?diabetes, Type 2 ? Renal/GU ?Renal InsufficiencyRenal disease  ? ?  ?Musculoskeletal ? ? Abdominal ?  ?Peds ? Hematology ?  ?Anesthesia Other Findings ? ? Reproductive/Obstetrics ? ?  ? ? ? ? ? ? ? ? ? ? ? ? ? ?  ?  ? ? ? ? ? ? ? ? ?Anesthesia Physical ?Anesthesia Plan ? ?ASA: 3 ? ?Anesthesia Plan: MAC and Regional  ? ?Post-op Pain Management:   ? ?Induction: Intravenous ? ?PONV Risk Score and Plan: 1 and Ondansetron, Propofol infusion and Treatment may vary due to age or medical condition ? ?Airway Management Planned: Simple Face Mask ? ?Additional Equipment:  ? ?Intra-op Plan:  ? ?Post-operative Plan:  ? ?Informed Consent: I have reviewed the patients History and Physical, chart, labs and discussed the procedure including the risks, benefits and alternatives for the proposed anesthesia with the patient or authorized representative who has indicated his/her understanding and acceptance.  ? ? ? ?Dental advisory given ? ?Plan Discussed with: CRNA, Anesthesiologist and Surgeon ? ?Anesthesia Plan Comments:   ? ? ? ? ? ? ?Anesthesia Quick Evaluation ? ?

## 2022-03-16 NOTE — ED Provider Notes (Signed)
?[facility] EMER[facility]ENC[facility] DEPARTMENT ?Provider Note ? ? ?CSN: [id number] ?Arrival date & time: 03/16/22  2048 ? ?  ? ?History ? ?Chief Complaint  ?Patient presents with  ? Wound Check  ? ? ?Corey Richards is a 76 y.o. male.  He had his right great toe operated on today by Dr. Stanford Breed and had regional anesthesia with a block near his right lateral knee.  He had some bleeding through the dressing on his foot but the wound around the knee continues to bleed through the dressing.  They have tried to re-bandage it without success.  He thinks he might of banged his foot getting in the house and that led to bleeding.  He is on Plavix and aspirin. ? ?HPI ? ?  ? ?Home Medications ?Prior to Admission medications   ?Medication Sig Start Date End Date Taking? Authorizing Provider  ?acetaminophen (TYLENOL) 500 MG tablet Take 1,500 mg by mouth daily as needed for mild pain.    [provider]  ?aspirin EC 81 MG tablet Take 81 mg by mouth daily. For 7 days ?Patient not taking: Reported on 03/16/2022    [provider]  ?atorvastatin (LIPITOR) 40 MG tablet Take 1 tablet (40 mg total) by mouth at bedtime. 02/01/22   Jerline Pain, MD  ?calcium carbonate (TUMS - DOSED IN MG ELEMENTAL CALCIUM) 500 MG chewable tablet Chew 1-2 tablets by mouth daily as needed for indigestion or heartburn.    [provider]  ?carvedilol (COREG) 12.5 MG tablet TAKE 1 TABLET BY MOUTH TWICE  DAILY WITH A MEAL ?Patient taking differently: Take 12.5 mg by mouth 2 (two) times daily with a meal. 02/24/22   Jerline Pain, MD  ?clindamycin (CLEOCIN) 300 MG capsule Take 1 capsule (300 mg total) by mouth 3 (three) times daily for 7 days. ?Patient not taking: Reported on 03/16/2022 03/09/22 03/16/22  Lorenda Peck, MD  ?clopidogrel (PLAVIX) 75 MG tablet TAKE 1 TABLET BY MOUTH DAILY ?Patient taking differently: Take 75 mg by mouth at bedtime. 02/02/22   Jerline Pain, MD  ?empagliflozin (JARDIANCE) 10 MG TABS tablet Take 1 tablet (10 mg  total) by mouth daily. 02/24/22   Jerline Pain, MD  ?furosemide (LASIX) 40 MG tablet TAKE 1 TABLET BY MOUTH  DAILY CAN TAKE EXTRA TABLET DAILY AS NEEDED FOR EDEMA ?Patient taking differently: Take 40-80 mg by mouth See admin instructions. '80mg'$  in the morning and '40mg'$  in the evening 02/24/22   Jerline Pain, MD  ?gabapentin (NEURONTIN) 300 MG capsule Take 300 mg by mouth at bedtime as needed (pain).    [provider]  ?HUMALOG KWIKPEN 100 UNIT/ML KwikPen Inject 8-18 Units into the skin 4 (four) times daily as needed (high blood sugar). 11/15/21   [provider]  ?oxyCODONE-acetaminophen (PERCOCET/ROXICET) 5-325 MG tablet Take 1 tablet by mouth every 6 (six) hours as needed. 03/16/22   Ulyses Amor, PA-C  ?primidone (MYSOLINE) 50 MG tablet Take 100 mg by mouth 2 (two) times daily after a meal.    [provider]  ?SODIUM BICARBONATE PO Take 648 mg by mouth 4 (four) times daily.    [provider]  ?terazosin (HYTRIN) 10 MG capsule TAKE 1 CAPSULE BY MOUTH DAILY  ALONG WITH 5 MG CAPSULE ?Patient taking differently: Take 10 mg by mouth at bedtime. Takes along with '5mg'$  dose for a total daily dose of '15mg'$  02/24/22   Jerline Pain, MD  ?terazosin (HYTRIN) 5 MG capsule TAKE 1 CAPSULE  BY MOUTH DAILY  ALONG WITH 10 MG CAPSULE ?Patient taking differently: Take 5 mg by mouth at bedtime. Takes along with '10mg'$  dose for a total daily dose of '15mg'$  02/24/22   Jerline Pain, MD  ?Vitamin D, Cholecalciferol, 25 MCG (1000 UT) CAPS Take 1,000 Units by mouth daily.    [provider]  ?   ? ?Allergies    ?Patient has no known allergies.   ? ?Review of Systems   ?Review of Systems  ?Constitutional:  Negative for fever.  ?Skin:  Positive for wound.  ? ?Physical Exam ?Updated Vital Signs ?BP 135/64 (BP Location: Right Arm)   Pulse 93   Temp 98.3 ?F (36.8 ?C) (Oral)   Resp 15   Ht '6\' 1"'$  (1.854 m)   Wt 111.1 kg   SpO2 96%   BMI 32.32 kg/m?  ?Physical Exam ?Vitals and nursing note reviewed.   ?Constitutional:   ?   Appearance: Normal appearance. He is well-developed.  ?HENT:  ?   Head: Normocephalic and atraumatic.  ?Eyes:  ?   Conjunctiva/sclera: Conjunctivae normal.  ?Pulmonary:  ?   Effort: Pulmonary effort is normal.  ?Musculoskeletal:  ?   Cervical back: Neck supple.  ?   Comments: Patient's right foot is wrapped in an Ace wrap.  There is some dried blood near where the great toe is but does not appear saturated or actively leaking gout.  He has another bandaged up on the lateral side of his right knee.  That area continues to ooze from a very small pinhole.  There is no significant hematoma underneath it.  ?Skin: ?   General: Skin is warm and dry.  ?Neurological:  ?   Mental Status: He is alert.  ?   GCS: GCS eye subscore is 4. GCS verbal subscore is 5. GCS motor subscore is 6.  ? ? ?ED Results / Procedures / Treatments   ?Labs ?(all labs ordered are listed, but only abnormal results are displayed) ?Labs Reviewed - No data to display ? ?EKG ?None ? ?Radiology ?VAS [place] ABI WITH/WO TBI ? ?Result Date: 03/16/2022 ? LOWER EXTREMITY DOPPLER STUDY Patient Name:  Corey Richards  Date of Exam:   03/15/2022 Medical Rec #: [id number]       Accession #:    [id number] Date of Birth: [birth year]       Patient Gender: M Patient Age:   38 years Exam Location:  [facility] Procedure:      VAS [place] ABI WITH/WO TBI Referring Phys: Jamelle Haring --------------------------------------------------------------------------------  Indications: Peripheral artery disease. High Risk Factors: Hypertension, hyperlipidemia, coronary artery disease. Other Factors: 02/25/2022 Right SFA angioplasty and stenting, Right above knee                popliteal angioplasty and stenting. Right below knee popliteal /                tibioperoneal angioplasty and stenting. Right 5th toe amputated.                All toes on the left are amputated.  Vascular Interventions: Bilateral SFA stents in [place]. Performing  Technologist: Alvia Grove RVT  Examination Guidelines: A complete evaluation includes at minimum, Doppler waveform signals and systolic blood pressure reading at the level of bilateral brachial, anterior tibial, and posterior tibial arteries, when vessel segments are accessible. Bilateral testing is considered an integral part of a complete examination. Photoelectric Plethysmograph (PPG) waveforms and toe systolic pressure readings are  included as required and additional duplex testing as needed. Limited examinations for reoccurring indications may be performed as noted.  ABI Findings: +---------+------------------+-----+----------+--------+ Right    Rt Pressure (mmHg)IndexWaveform  Comment  +---------+------------------+-----+----------+--------+ Brachial 153                                       +---------+------------------+-----+----------+--------+ PTA      254               1.50 monophasic         +---------+------------------+-----+----------+--------+ DP       254               1.50 monophasic         +---------+------------------+-----+----------+--------+ Great Toe                                 bandaged +---------+------------------+-----+----------+--------+ +---------+------------------+-----+----------+---------+ Left     Lt Pressure (mmHg)IndexWaveform  Comment   +---------+------------------+-----+----------+---------+ Brachial 169                                        +---------+------------------+-----+----------+---------+ PTA      49                0.29 monophasic          +---------+------------------+-----+----------+---------+ DP       254               1.50 monophasic          +---------+------------------+-----+----------+---------+ Great Toe                                 amputated +---------+------------------+-----+----------+---------+   Summary: Right: Resting right ankle-brachial index indicates noncompressible right lower  extremity arteries. Left: Resting left ankle-brachial index indicates noncompressible left lower extremity arteries.  *See table(s) above for measurements and observations.  Electronically signed by Servando Snare MD on 03/16/2022 at 4:17:22 PM.    Final    ? ?Procedures ?Procedures  ?

## 2022-03-16 NOTE — Discharge Instructions (Signed)
You were seen in the emergency department for evaluation of bleeding from your toe and regional block wound.  We were able to get the regional block area stopped.  Please keep the dressing on until tomorrow.  If rebleeds hold gentle pressure.  Follow-up with your vascular surgeon as scheduled.  Return to the emergency department if any worsening or concerning symptoms ?

## 2022-03-16 NOTE — Op Note (Signed)
DATE OF SERVICE: 03/16/2022 ? ?PATIENT:  Corey Richards  76 y.o. male ? ?PRE-OPERATIVE DIAGNOSIS:  osteomyelitis of right great toe tip; recent revascularization ? ?POST-OPERATIVE DIAGNOSIS:  Same ? ?PROCEDURE:   ?Right great toe partial amputation ? ?SURGEON:  Surgeon(s) and Role: ?   * [name], MD - Primary ? ?ASSISTANT: none ? ?ANESTHESIA:   regional and MAC ? ?EBL: minimal ? ?BLOOD ADMINISTERED:none ? ?DRAINS: none  ? ?LOCAL MEDICATIONS USED:  NONE ? ?SPECIMEN:  none ? ?COUNTS: confirmed correct . ? ?TOURNIQUET:  none ? ?PATIENT DISPOSITION:  PACU - hemodynamically stable. ?  ?Delay start of Pharmacological VTE agent (>24hrs) due to surgical blood loss or risk of bleeding: no ? ?INDICATION FOR PROCEDURE: Corey Richards is a 76 y.o. male with right great toe tip likely osteomyelitis. I recently revascularized him endovascularly. After careful discussion of risks, benefits, and alternatives the patient was offered right great toe partial toe amputation. The patient  understood and wished to proceed. ? ?OPERATIVE FINDINGS: healthy bleeding tissue at amputation margin. Right great toe amputated at interphalangeal joint.  ? ?DESCRIPTION OF PROCEDURE: After identification of the patient in the pre-operative holding area, the patient was transferred to the operating room. The patient was positioned supine on the operating room table. Anesthesia was induced. The right foot and ankle were prepped and draped in standard fashion. A surgical pause was performed confirming correct patient, procedure, and operative location. ? ?A fishmouth incision was made about the right great toe interphalangeal joint. Healthy bleeding was encountered. The amputation was carried down to the bone with electrocautery. The bone was amputated with a bone cutter. The bone was debrided back to the interphalangeal joint with a rongeur. The wound was copiously irrigated. The wound was closed in layers using 3-O vicryl and 3-O nylon. ? ?Upon  completion of the case instrument and sharps counts were confirmed correct. The patient was transferred to the PACU in good condition. I was present for all portions of the procedure. ? ?Yevonne Aline. Stanford Breed, MD ?[facility] ?Office Phone Number: [phone] ?03/16/2022 1:59 PM ? ? ? ?

## 2022-03-16 NOTE — ED Triage Notes (Addendum)
Pt arrives with c/o of bleeding from his surgical site on his toe and from the site where he had an epidural block on his leg. Per wife, she has changed the bandage around 5-7 times. Pt does take Plavix and Asprin.  ?

## 2022-03-16 NOTE — Transfer of Care (Signed)
Immediate Anesthesia Transfer of Care Note ? ?Patient: Corey Richards ? ?Procedure(s) Performed: RIGHT GREAT TOE  PARTIAL AMPUTATION (Right: Foot) ? ?Patient Location: PACU ? ?Anesthesia Type:MAC combined with regional for post-op pain ? ?Level of Consciousness: awake, alert  and oriented ? ?Airway & Oxygen Therapy: Patient Spontanous Breathing and Patient connected to face mask oxygen ? ?Post-op Assessment: Report given to RN and Post -op Vital signs reviewed and stable ? ?Post vital signs: Reviewed and stable ? ?Last Vitals:  ?Vitals Value Taken Time  ?BP 114/72   ?Temp    ?Pulse 78 03/16/22 1401  ?Resp 14 03/16/22 1401  ?SpO2 100 % 03/16/22 1401  ?Vitals shown include unvalidated device data. ? ?Last Pain:  ?Vitals:  ? 03/16/22 1111  ?TempSrc:   ?PainSc: 4   ?   ? ?Patients Stated Pain Goal: 3 (03/16/22 1111) ? ?Complications: No notable events documented. ?

## 2022-03-16 NOTE — Interval H&P Note (Signed)
History and Physical Interval Note: ? ?03/16/2022 ?12:28 PM ? ?Corey Richards  has presented today for surgery, with the diagnosis of PAD.  The various methods of treatment have been discussed with the patient and family. After consideration of risks, benefits and other options for treatment, the patient has consented to  Procedure(s): ?RIGHT GREAT TOE AMPUTATION (Right) as a surgical intervention.  The patient's history has been reviewed, patient examined, no change in status, stable for surgery.  I have reviewed the patient's chart and labs.  Questions were answered to the patient's satisfaction.   ? ? ?[name] ? ? ?

## 2022-03-17 ENCOUNTER — Encounter (HOSPITAL_COMMUNITY): Payer: Self-pay | Admitting: Vascular Surgery

## 2022-03-17 NOTE — Anesthesia Postprocedure Evaluation (Signed)
Anesthesia Post Note ? ?Patient: Paolo Okane ? ?Procedure(s) Performed: RIGHT GREAT TOE  PARTIAL AMPUTATION (Right: Foot) ? ?  ? ?Patient location during evaluation: PACU ?Anesthesia Type: Regional ?Level of consciousness: awake ?Pain management: pain level controlled ?Vital Signs Assessment: post-procedure vital signs reviewed and stable ?Respiratory status: spontaneous breathing, nonlabored ventilation, respiratory function stable and patient connected to nasal cannula oxygen ?Cardiovascular status: stable and blood pressure returned to baseline ?Postop Assessment: no apparent nausea or vomiting ?Anesthetic complications: no ? ? ?No notable events documented. ? ?Last Vitals:  ?Vitals:  ? 03/16/22 1430 03/16/22 1440  ?BP: 123/67   ?Pulse: 76 73  ?Resp: 16 20  ?Temp: (!) 36.4 ?C (!) 36.4 ?C  ?SpO2: 99% 97%  ?  ?Last Pain:  ?Vitals:  ? 03/16/22 1430  ?TempSrc:   ?PainSc: 0-No pain  ? ? ?  ?  ?  ?  ?  ?  ? ?Norena Bratton P Marvis Bakken ? ? ? ? ?

## 2022-03-23 ENCOUNTER — Ambulatory Visit (HOSPITAL_COMMUNITY): Payer: Medicare Other

## 2022-03-23 ENCOUNTER — Telehealth (HOSPITAL_COMMUNITY): Payer: Self-pay | Admitting: *Deleted

## 2022-03-23 NOTE — Telephone Encounter (Signed)
Left message on voicemail per DPR in reference to upcoming appointment scheduled on 03/31/2022 at 10:00 with detailed instructions given per Myocardial Perfusion Study Information Sheet for the test. LM to arrive 15 minutes early, and that it is imperative to arrive on time for appointment to keep from having the test rescheduled. If you need to cancel or reschedule your appointment, please call the office within 24 hours of your appointment. Failure to do so may result in a cancellation of your appointment, and a $50 no show fee. Phone number given for call back for any questions.   ?

## 2022-03-27 NOTE — Progress Notes (Signed)
?POST OPERATIVE OFFICE NOTE ? ? ? ?CC:  F/u for surgery ? ?HPI:  This is a 76 y.o. male who has hx of endovascular intervention in [place]. He now has ulceration about his right great toe. S/P femoropopliteal and TP trunk stenting by Dr. Stanford Breed 02/25/22.   He subsequently underwent right great toe partial amputation on 03/16/2022 by Dr. Stanford Breed and returns today for follow up.  ABI on 03/15/2022 revealed 1.5 on the right with monophasic signals and 1.5 on the left also with monophasic signals. (Vessels non compressible).  He has hx of left TMA.   ? ?Pt returns today for follow up accompanied by his wife.   Pt states he is doing well and feel his toe amp site is healing nicely.  He does not have claudication, rest pain or new wounds.  He does have a sore on the second toe but this is felt to be due to tape and is getting better. He is wearing his post op shoe.  They do have a trip planned to the [place] in a couple of weeks.  ? ?No Known Allergies ? ?Current Outpatient Medications  ?Medication Sig Dispense Refill  ? acetaminophen (TYLENOL) 500 MG tablet Take 1,500 mg by mouth daily as needed for mild pain.    ? aspirin EC 81 MG tablet Take 81 mg by mouth daily. For 7 days (Patient not taking: Reported on 03/16/2022)    ? atorvastatin (LIPITOR) 40 MG tablet Take 1 tablet (40 mg total) by mouth at bedtime. 90 tablet 3  ? calcium carbonate (TUMS - DOSED IN MG ELEMENTAL CALCIUM) 500 MG chewable tablet Chew 1-2 tablets by mouth daily as needed for indigestion or heartburn.    ? carvedilol (COREG) 12.5 MG tablet TAKE 1 TABLET BY MOUTH TWICE  DAILY WITH A MEAL (Patient taking differently: Take 12.5 mg by mouth 2 (two) times daily with a meal.) 180 tablet 3  ? clopidogrel (PLAVIX) 75 MG tablet TAKE 1 TABLET BY MOUTH DAILY (Patient taking differently: Take 75 mg by mouth at bedtime.) 90 tablet 1  ? empagliflozin (JARDIANCE) 10 MG TABS tablet Take 1 tablet (10 mg total) by mouth daily. 90 tablet 3  ? furosemide (LASIX) 40 MG  tablet TAKE 1 TABLET BY MOUTH  DAILY CAN TAKE EXTRA TABLET DAILY AS NEEDED FOR EDEMA (Patient taking differently: Take 40-80 mg by mouth See admin instructions. '80mg'$  in the morning and '40mg'$  in the evening) 180 tablet 3  ? gabapentin (NEURONTIN) 300 MG capsule Take 300 mg by mouth at bedtime as needed (pain).    ? HUMALOG KWIKPEN 100 UNIT/ML KwikPen Inject 8-18 Units into the skin 4 (four) times daily as needed (high blood sugar).    ? oxyCODONE-acetaminophen (PERCOCET/ROXICET) 5-325 MG tablet Take 1 tablet by mouth every 6 (six) hours as needed. 20 tablet 0  ? primidone (MYSOLINE) 50 MG tablet Take 100 mg by mouth 2 (two) times daily after a meal.    ? SODIUM BICARBONATE PO Take 648 mg by mouth 4 (four) times daily.    ? terazosin (HYTRIN) 10 MG capsule TAKE 1 CAPSULE BY MOUTH DAILY  ALONG WITH 5 MG CAPSULE (Patient taking differently: Take 10 mg by mouth at bedtime. Takes along with '5mg'$  dose for a total daily dose of '15mg'$ ) 90 capsule 3  ? terazosin (HYTRIN) 5 MG capsule TAKE 1 CAPSULE BY MOUTH DAILY  ALONG WITH 10 MG CAPSULE (Patient taking differently: Take 5 mg by mouth at bedtime. Takes along with  $'10mg'U$  dose for a total daily dose of '15mg'$ ) 90 capsule 3  ? Vitamin D, Cholecalciferol, 25 MCG (1000 UT) CAPS Take 1,000 Units by mouth daily.    ? ?No current facility-administered medications for this visit.  ? ? ? ROS:  See HPI ? ?Physical Exam: ? ?Today's Vitals  ? 03/29/22 1023  ?BP: 117/63  ?Pulse: 86  ?Resp: 20  ?Temp: 98 ?F (36.7 ?C)  ?TempSrc: Temporal  ?SpO2: 99%  ?Weight: 245 lb (111.1 kg)  ?Height: '5\' 11"'$  (1.803 m)  ? ?Body mass index is 34.17 kg/m?. ? ? ?Incision:   ? ? ?Extremities:  brisk biphasic doppler signals right DP/PT/peroneal.  Toe amputation site is healing.  Sore on 2nd toe improving per pt and wife.  ? ? ? ?Assessment/Plan:  This is a 76 y.o. male who is s/p: ?hx of endovascular intervention in [place]. He now has ulceration about his right great toe. S/P femoropopliteal and TP trunk  stenting by Dr. Stanford Breed 02/25/22.   He subsequently underwent right great toe partial amputation on 03/16/2022 by Dr. Stanford Breed ? ?-pt seen with Dr. Stanford Breed and pt is doing well with brisk biphasic doppler signals right DP/PT/peroneal.   ?-sutures removed today.  Discussed with pt and wife that he should continue to wear the shoe until the toe has completely healed to give it the best chance to heal.   ?-Dr. Stanford Breed felt pt ok to travel to [place] ?-will have pt return in 3 months for f/u BLE arterial duplex and and ABI.  They know to call sooner if he has any issues. ? ? ?Leontine Locket, PAC ?[facility] ?[phone] ? ? ?Clinic MD:  Stanford Breed ? ?

## 2022-03-29 ENCOUNTER — Ambulatory Visit (INDEPENDENT_AMBULATORY_CARE_PROVIDER_SITE_OTHER): Payer: Medicare Other | Admitting: Physician Assistant

## 2022-03-29 VITALS — BP 117/63 | HR 86 | Temp 98.0°F | Resp 20 | Ht 71.0 in | Wt 245.0 lb

## 2022-03-29 DIAGNOSIS — I70235 Atherosclerosis of native arteries of right leg with ulceration of other part of foot: Secondary | ICD-10-CM | POA: Diagnosis not present

## 2022-03-30 ENCOUNTER — Encounter (INDEPENDENT_AMBULATORY_CARE_PROVIDER_SITE_OTHER): Payer: Medicare Other | Admitting: Ophthalmology

## 2022-03-30 DIAGNOSIS — E113393 Type 2 diabetes mellitus with moderate nonproliferative diabetic retinopathy without macular edema, bilateral: Secondary | ICD-10-CM

## 2022-03-30 DIAGNOSIS — I10 Essential (primary) hypertension: Secondary | ICD-10-CM | POA: Diagnosis not present

## 2022-03-30 DIAGNOSIS — H35033 Hypertensive retinopathy, bilateral: Secondary | ICD-10-CM | POA: Diagnosis not present

## 2022-03-30 DIAGNOSIS — H43813 Vitreous degeneration, bilateral: Secondary | ICD-10-CM | POA: Diagnosis not present

## 2022-03-31 ENCOUNTER — Ambulatory Visit (HOSPITAL_COMMUNITY): Payer: Medicare Other | Attending: Internal Medicine

## 2022-03-31 DIAGNOSIS — R079 Chest pain, unspecified: Secondary | ICD-10-CM | POA: Insufficient documentation

## 2022-03-31 DIAGNOSIS — I2510 Atherosclerotic heart disease of native coronary artery without angina pectoris: Secondary | ICD-10-CM

## 2022-03-31 DIAGNOSIS — E7800 Pure hypercholesterolemia, unspecified: Secondary | ICD-10-CM

## 2022-03-31 DIAGNOSIS — I739 Peripheral vascular disease, unspecified: Secondary | ICD-10-CM | POA: Diagnosis not present

## 2022-03-31 LAB — MYOCARDIAL PERFUSION IMAGING
LV dias vol: 229 mL (ref 62–150)
LV sys vol: 178 mL
Nuc Stress EF: 22 %
Peak HR: 85 {beats}/min
Rest HR: 74 {beats}/min
Rest Nuclear Isotope Dose: 9.7 mCi
SDS: 3
SRS: 15
SSS: 18
ST Depression (mm): 0 mm
Stress Nuclear Isotope Dose: 32.2 mCi
TID: 1.1

## 2022-03-31 MED ORDER — REGADENOSON 0.4 MG/5ML IV SOLN
0.4000 mg | Freq: Once | INTRAVENOUS | Status: AC
  Administered 2022-03-31: 0.4 mg via INTRAVENOUS

## 2022-03-31 MED ORDER — TECHNETIUM TC 99M TETROFOSMIN IV KIT
32.2000 | PACK | Freq: Once | INTRAVENOUS | Status: AC | PRN
  Administered 2022-03-31: 32.2 via INTRAVENOUS
  Filled 2022-03-31: qty 33

## 2022-03-31 MED ORDER — TECHNETIUM TC 99M TETROFOSMIN IV KIT
9.7000 | PACK | Freq: Once | INTRAVENOUS | Status: AC | PRN
  Administered 2022-03-31: 9.7 via INTRAVENOUS
  Filled 2022-03-31: qty 10

## 2022-04-01 ENCOUNTER — Other Ambulatory Visit: Payer: Self-pay | Admitting: *Deleted

## 2022-04-01 DIAGNOSIS — I70235 Atherosclerosis of native arteries of right leg with ulceration of other part of foot: Secondary | ICD-10-CM

## 2022-04-01 DIAGNOSIS — I739 Peripheral vascular disease, unspecified: Secondary | ICD-10-CM

## 2022-04-04 DIAGNOSIS — I5020 Unspecified systolic (congestive) heart failure: Secondary | ICD-10-CM | POA: Diagnosis not present

## 2022-04-04 DIAGNOSIS — N2581 Secondary hyperparathyroidism of renal origin: Secondary | ICD-10-CM | POA: Diagnosis not present

## 2022-04-04 DIAGNOSIS — D631 Anemia in chronic kidney disease: Secondary | ICD-10-CM | POA: Diagnosis not present

## 2022-04-04 DIAGNOSIS — Z951 Presence of aortocoronary bypass graft: Secondary | ICD-10-CM | POA: Diagnosis not present

## 2022-04-04 DIAGNOSIS — I739 Peripheral vascular disease, unspecified: Secondary | ICD-10-CM | POA: Diagnosis not present

## 2022-04-04 DIAGNOSIS — E872 Acidosis, unspecified: Secondary | ICD-10-CM | POA: Diagnosis not present

## 2022-04-04 DIAGNOSIS — I129 Hypertensive chronic kidney disease with stage 1 through stage 4 chronic kidney disease, or unspecified chronic kidney disease: Secondary | ICD-10-CM | POA: Diagnosis not present

## 2022-04-04 DIAGNOSIS — E1122 Type 2 diabetes mellitus with diabetic chronic kidney disease: Secondary | ICD-10-CM | POA: Diagnosis not present

## 2022-04-04 DIAGNOSIS — N1832 Chronic kidney disease, stage 3b: Secondary | ICD-10-CM | POA: Diagnosis not present

## 2022-04-05 ENCOUNTER — Telehealth: Payer: Self-pay | Admitting: *Deleted

## 2022-04-05 DIAGNOSIS — I5022 Chronic systolic (congestive) heart failure: Secondary | ICD-10-CM

## 2022-04-05 NOTE — Telephone Encounter (Signed)
Pt is aware of echo order and that he will be contacted to be scheduled. ?

## 2022-04-05 NOTE — Telephone Encounter (Signed)
-----   Message from Jerline Pain, MD sent at 04/01/2022  5:53 PM EDT ----- ?Regarding: RE: EF 22% on lexiscan ?It would not be a bad idea to repeat the echocardiogram.  Go ahead and order.  Sometimes as you know the nuclear EF's are lower than actual. ?-Mark ? ?----- Message ----- ?From: Shellia Cleverly, RN ?Sent: 04/01/2022   1:00 PM EDT ?To: Jerline Pain, MD ?Subject: EF 22% on lexiscan                            ? ?Pt's EF in 22% on lexiscan - on last echo (2021) was 35-40%.  Does he need a repeat echo? Just wondering... ? ?Thanks ? ?Pam  ? ? ?

## 2022-04-18 ENCOUNTER — Telehealth: Payer: Self-pay | Admitting: *Deleted

## 2022-04-18 ENCOUNTER — Ambulatory Visit (HOSPITAL_COMMUNITY): Payer: Medicare Other | Attending: Cardiology

## 2022-04-18 DIAGNOSIS — I5022 Chronic systolic (congestive) heart failure: Secondary | ICD-10-CM

## 2022-04-18 DIAGNOSIS — E1165 Type 2 diabetes mellitus with hyperglycemia: Secondary | ICD-10-CM | POA: Diagnosis not present

## 2022-04-18 DIAGNOSIS — E119 Type 2 diabetes mellitus without complications: Secondary | ICD-10-CM | POA: Diagnosis not present

## 2022-04-18 DIAGNOSIS — Z794 Long term (current) use of insulin: Secondary | ICD-10-CM | POA: Diagnosis not present

## 2022-04-18 LAB — ECHOCARDIOGRAM COMPLETE
Area-P 1/2: 4.49 cm2
S' Lateral: 5.8 cm

## 2022-04-18 MED ORDER — PERFLUTREN LIPID MICROSPHERE
1.0000 mL | INTRAVENOUS | Status: AC | PRN
  Administered 2022-04-18: 1 mL via INTRAVENOUS

## 2022-04-18 NOTE — Telephone Encounter (Signed)
Patient's wife called stating that right great toe was swollen,red, and draining.  This has worsened since their trip.  An appointment has been scheduled to see Dr Stanford Breed 04/19/2022. ?I instructed to continue elevation of right lower extremity.  Wife voiced understanding of the instructions. ?

## 2022-04-18 NOTE — Progress Notes (Signed)
? ? ?ASSESSMENT & PLAN:  ?76 y.o. male with currently asymptomatic peripheral arterial disease s/p endovascular intervention in [place]. He now has ulceration about his right great toe. S/P femoropopliteal and TP trunk stenting by me 02/25/22. ? ?Recommend the following which can slow the progression of atherosclerosis and reduce the risk of major adverse cardiac / limb events:  ?Complete cessation from all tobacco products. ?Blood glucose control with goal A1c < 7%. ?Blood pressure control with goal blood pressure < 140/90 mmHg. ?Lipid reduction therapy with goal LDL-C <100 mg/dL (<70 if symptomatic from PAD).  ?Aspirin 81mg  PO QD.  ?Clopidogrel 75mg  PO QD. ?Atorvastatin 40-80mg  PO QD (or other "high intensity" statin therapy). ? ?Superficial infection about the right great toe status post partial amputation on 03/16/2022.  I suspect this will improve with swelling reduction and oral antibiotics.  I prescribed him doxycycline.  I will see him back in 2 weeks for wound check.  I counseled him to go to the ER she develops systemic signs of infection. ? ?CHIEF COMPLAINT:   ?Establish vascular care ? ?HISTORY:  ?HISTORY OF PRESENT ILLNESS: ?Corey Richards is a 76 y.o. male with history of DM, HF, HLA, HTN, AF, CAD s/p CABG. he has a strong history of peripheral arterial disease.  He is undergone multiple endovascular interventions in [place] near [place] with Dr Jaclyn Shaggy.  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  His right fifth toe is also surgically absent.  He is ambulatory.  He is a bit unsteady and ambulates with a cane.  He denies location, ischemic rest pain, ischemic ulceration. ? ?02/22/22: Patient returns to clinic. He has an ulcer about his right great toe. He reports some pain about the toe ulcer. No antecedent claudication. No ischemic rest pain.  ? ?03/15/22: No major issues from endovascular intervention.  The patient reports some mild pain in his right leg which  slowly resolved over the course of the weekend after endovascular intervention.  He continues to undergo wound care to his toe.  Unfortunately he has some purulent drainage from his toe today. ? ?04/19/22: Patient returns to clinic after partial right great toe amputation/5/23.  He went to the [place] recently and had a very good trip.  Unfortunately he was a bit liberal with his salt intake and did not rest his foot as he should have.  His leg is significantly swollen.  The toe amputation has superficial dehiscence.  There is some surrounding cellulitis. ? ?Past Medical History:  ?Diagnosis Date  ? Abnormal liver enzymes   ? Acute renal failure (ARF) (HCC)   ? Anemia   ? Benign prostatic hyperplasia   ? CHF (congestive heart failure) ([place])   ? Coronary arteriosclerosis   ? Diabetes mellitus without complication ([place])   ? Hyperlipidemia   ? Hypertension   ? Pain in joint of left hip   ? Pain in joint of right hip   ? Paroxysmal atrial fibrillation (HCC)   ? Peripheral edema   ? Peripheral vascular disease ([place])   ? 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 Sleep apnea   ? Stroke [facility])   ? ? ?Past Surgical History:  ?Procedure Laterality Date  ? ABDOMINAL AORTOGRAM W/LOWER EXTREMITY Right 02/25/2022  ? Procedure: ABDOMINAL AORTOGRAM W/LOWER EXTREMITY;  Surgeon: [name], MD;  Location: [facility];  Service: Cardiovascular;  Laterality: Right;  ? AMPUTATION Right 03/16/2022  ? Procedure: RIGHT  GREAT TOE  PARTIAL AMPUTATION;  Surgeon: [name], MD;  Location: [facility] OR;  Service: Vascular;  Laterality: Right;  ? cataract surgery    ? coronary artery bypass grafts N/A   ? peripheral angiography Bilateral   ? PERIPHERAL VASCULAR INTERVENTION Right 02/25/2022  ? Procedure: PERIPHERAL VASCULAR INTERVENTION;  Surgeon: [name], MD;  Location: [facility];  Service: Cardiovascular;  Laterality: Right;  SFA/ Popliteal  ? ? ?Family History  ?Problem Relation Age of Onset  ?  Alzheimer's disease Father   ? Heart attack Father   ? ? ?Social History  ? ?Socioeconomic History  ? Marital status: Married  ?  Spouse name: Not on file  ? Number of children: Not on file  ? Years of education: Not on file  ? Highest education level: Not on file  ?Occupational History  ? Not on file  ?Tobacco Use  ? Smoking status: Former  ?  Types: Cigars  ?  Start date: 07/24/1969  ?  Quit date: 07/24/1981  ?  Years since quitting: 40.7  ? Smokeless tobacco: Never  ?Vaping Use  ? Vaping Use: Never used  ?Substance and Sexual Activity  ? Alcohol use: Never  ? Drug use: Not on file  ? Sexual activity: Yes  ?Other Topics Concern  ? Not on file  ?Social History Narrative  ? Not on file  ? ?Social Determinants of Health  ? ?Financial Resource Strain: Low Risk   ? Difficulty of Paying Living Expenses: Not hard at all  ?Food Insecurity: No Food Insecurity  ? Worried About Charity fundraiser in the Last Year: Never true  ? Ran Out of Food in the Last Year: Never true  ?Transportation Needs: No Transportation Needs  ? Lack of Transportation (Medical): No  ? Lack of Transportation (Non-Medical): No  ?Physical Activity: Inactive  ? Days of Exercise per Week: 0 days  ? Minutes of Exercise per Session: 0 min  ?Stress: No Stress Concern Present  ? Feeling of Stress : Not at all  ?Social Connections: Moderately Integrated  ? Frequency of Communication with Friends and Family: Twice a week  ? Frequency of Social Gatherings with Friends and Family: Twice a week  ? Attends Religious Services: 1 to 4 times per year  ? Active Member of Clubs or Organizations: No  ? Attends Archivist Meetings: Never  ? Marital Status: Married  ?Intimate Partner Violence: Not At Risk  ? Fear of Current or Ex-Partner: No  ? Emotionally Abused: No  ? Physically Abused: No  ? Sexually Abused: No  ? ? ?No Known Allergies ? ?Current Outpatient Medications  ?Medication Sig Dispense Refill  ? acetaminophen (TYLENOL) 500 MG tablet Take 1,500 mg by  mouth daily as needed for mild pain.    ? aspirin EC 81 MG tablet Take 81 mg by mouth daily. For 7 days    ? atorvastatin (LIPITOR) 40 MG tablet Take 1 tablet (40 mg total) by mouth at bedtime. 90 tablet 3  ? calcium carbonate (TUMS - DOSED IN MG ELEMENTAL CALCIUM) 500 MG chewable tablet Chew 1-2 tablets by mouth daily as needed for indigestion or heartburn.    ? carvedilol (COREG) 12.5 MG tablet TAKE 1 TABLET BY MOUTH TWICE  DAILY WITH A MEAL (Patient taking differently: Take 12.5 mg by mouth 2 (two) times daily with a meal.) 180 tablet 3  ? clopidogrel (PLAVIX) 75 MG tablet TAKE 1 TABLET BY MOUTH DAILY (Patient taking  differently: Take 75 mg by mouth at bedtime.) 90 tablet 1  ? empagliflozin (JARDIANCE) 10 MG TABS tablet Take 1 tablet (10 mg total) by mouth daily. 90 tablet 3  ? furosemide (LASIX) 40 MG tablet TAKE 1 TABLET BY MOUTH  DAILY CAN TAKE EXTRA TABLET DAILY AS NEEDED FOR EDEMA (Patient taking differently: Take 40-80 mg by mouth See admin instructions. 80mg  in the morning and 40mg  in the evening) 180 tablet 3  ? gabapentin (NEURONTIN) 300 MG capsule Take 300 mg by mouth at bedtime as needed (pain).    ? HUMALOG KWIKPEN 100 UNIT/ML KwikPen Inject 8-18 Units into the skin 4 (four) times daily as needed (high blood sugar).    ? oxyCODONE-acetaminophen (PERCOCET/ROXICET) 5-325 MG tablet Take 1 tablet by mouth every 6 (six) hours as needed. 20 tablet 0  ? primidone (MYSOLINE) 50 MG tablet Take 100 mg by mouth 2 (two) times daily after a meal.    ? SODIUM BICARBONATE PO Take 648 mg by mouth 4 (four) times daily.    ? terazosin (HYTRIN) 10 MG capsule TAKE 1 CAPSULE BY MOUTH DAILY  ALONG WITH 5 MG CAPSULE (Patient taking differently: Take 10 mg by mouth at bedtime. Takes along with 5mg  dose for a total daily dose of 15mg ) 90 capsule 3  ? terazosin (HYTRIN) 5 MG capsule TAKE 1 CAPSULE BY MOUTH DAILY  ALONG WITH 10 MG CAPSULE (Patient taking differently: Take 5 mg by mouth at bedtime. Takes along with 10mg  dose for  a total daily dose of 15mg ) 90 capsule 3  ? Vitamin D, Cholecalciferol, 25 MCG (1000 UT) CAPS Take 1,000 Units by mouth daily.    ? ?No current facility-administered medications for this visit.  ? ? ?REVIEW OF

## 2022-04-19 ENCOUNTER — Ambulatory Visit: Payer: Medicare Other | Admitting: Vascular Surgery

## 2022-04-19 ENCOUNTER — Encounter: Payer: Self-pay | Admitting: Vascular Surgery

## 2022-04-19 VITALS — BP 110/61 | HR 87 | Temp 98.0°F | Resp 20 | Ht 73.0 in | Wt 250.0 lb

## 2022-04-19 DIAGNOSIS — I739 Peripheral vascular disease, unspecified: Secondary | ICD-10-CM | POA: Diagnosis not present

## 2022-04-19 MED ORDER — DOXYCYCLINE HYCLATE 100 MG PO CAPS: 100.0000 mg | ORAL_CAPSULE | Freq: Two times a day (BID) | ORAL | 0 refills | Status: AC

## 2022-04-26 ENCOUNTER — Encounter: Payer: Self-pay | Admitting: Family Medicine

## 2022-04-27 ENCOUNTER — Telehealth: Payer: Self-pay | Admitting: Family Medicine

## 2022-04-27 NOTE — Telephone Encounter (Signed)
Forms completed

## 2022-04-27 NOTE — Telephone Encounter (Signed)
[facility] is calling in needing the death cert  signed and dated. He can get to it through the portal.

## 2022-04-27 NOTE — Telephone Encounter (Signed)
Please advise message below  °

## 2022-04-27 NOTE — Telephone Encounter (Signed)
Pts daughter called very upset that [facility] Death Certificate has not been signed by Dr Ethelene Hal. The [facility] needs this done before they can follow through.  ? ?Pt states that she talked to someone last week, and they mentioned they would sign it, Lacie Scotts said she did not speak with anyone and knows nothing about this.  ? ?CB [phone] ?

## 2022-04-28 NOTE — Telephone Encounter (Signed)
Daughter Corey Richards is calling she and his wife really want to close this. They need Dr Bebe Shaggy signature and dated to be able to move forward.

## 2022-04-28 NOTE — Telephone Encounter (Signed)
Spoke with patients daughter who verbally understood forms completed and verified with funeral home that they have received completed forms.

## 2022-05-03 ENCOUNTER — Ambulatory Visit: Payer: Medicare Other | Admitting: Vascular Surgery

## 2022-05-04 ENCOUNTER — Other Ambulatory Visit: Payer: Self-pay | Admitting: Cardiology

## 2022-05-12 DEATH — deceased

## 2022-06-28 ENCOUNTER — Encounter (HOSPITAL_COMMUNITY): Payer: Medicare Other

## 2022-06-28 ENCOUNTER — Other Ambulatory Visit (HOSPITAL_COMMUNITY): Payer: Medicare Other

## 2022-06-28 ENCOUNTER — Ambulatory Visit: Payer: Medicare Other | Admitting: Vascular Surgery

## 2022-08-22 ENCOUNTER — Ambulatory Visit: Payer: Medicare Other | Admitting: Cardiology

## 2022-09-04 IMAGING — MR MR FOOT*R* W/O CM
5 series · 40 of 40 positions shown · non-contrast
Comparison: X-ray 02/10/2022

CLINICAL DATA: Foot swelling, diabetic, osteomyelitis suspected,
xray done vs possible fracutre of distal phalanx right great toe
with traumatic injury

EXAM:
MRI OF THE RIGHT FOREFOOT WITHOUT CONTRAST
TECHNIQUE: Multiplanar, multisequence MR imaging of the right forefoot was
performed. No intravenous contrast was administered.

[Series 5: T1 · coronal · 3.0mm · 0.56mm/px · 12 of 49 slices shown (1 of 2)]
[im 1/49]
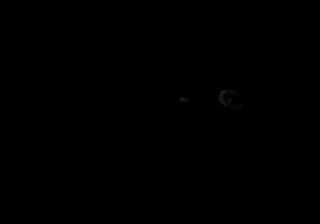
[im 5/49]
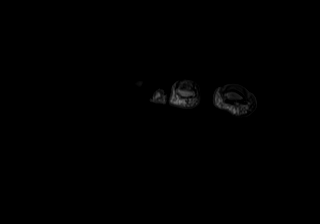
[im 9/49]
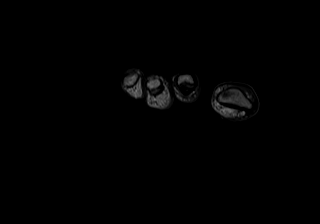
[im 14/49]
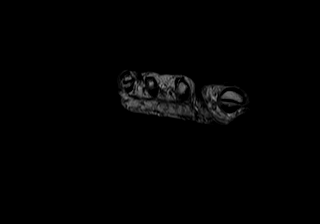
[im 18/49]
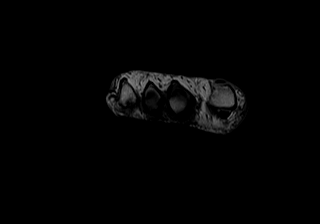
[im 22/49]
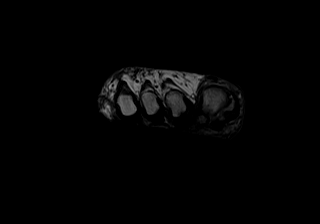
[im 27/49]
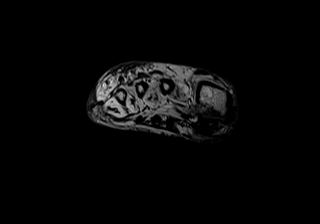
[im 31/49]
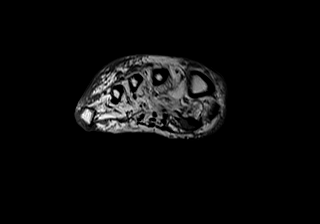
[im 35/49]
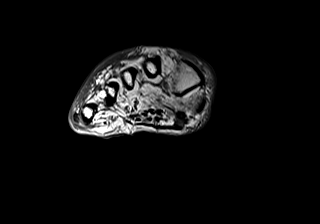
[im 40/49]
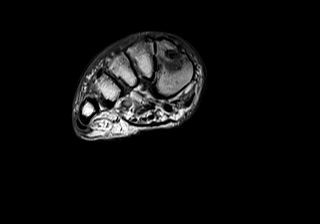
[im 44/49]
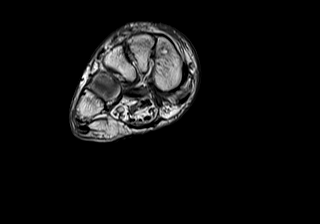
[im 49/49]
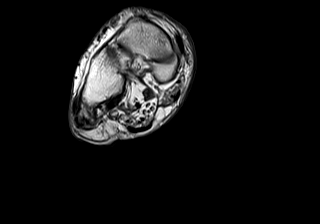

[Series 6: T2 fat-sat · coronal · 3.0mm · 0.56mm/px · 11 of 49 slices shown (1 of 2)]
[im 1/49]
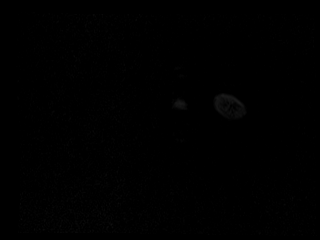
[im 5/49]
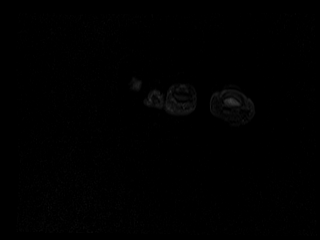
[im 10/49]
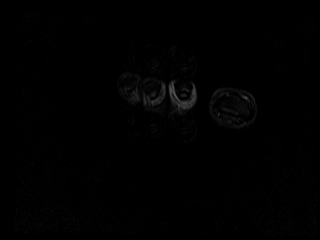
[im 15/49]
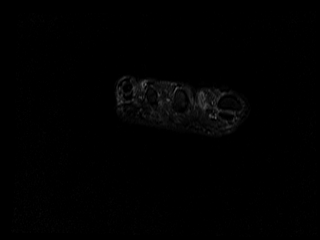
[im 20/49]
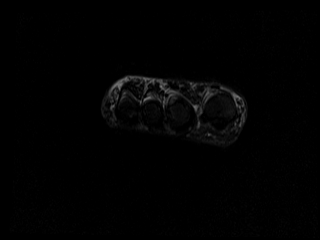
[im 25/49]
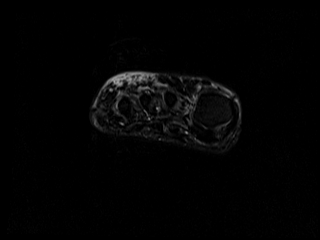
[im 29/49]
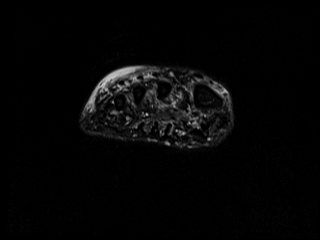
[im 34/49]
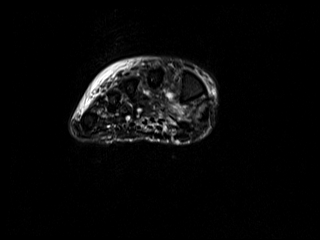
[im 39/49]
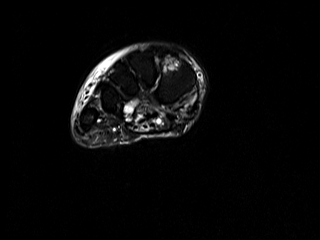
[im 44/49]
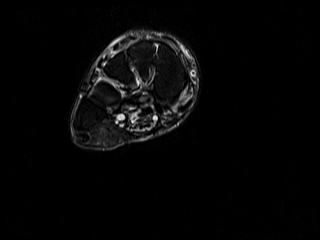
[im 49/49]
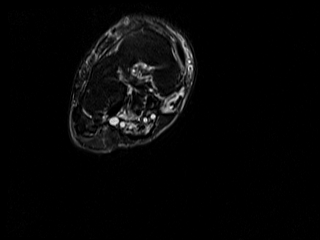

[Series 7: T1 · axial · 3.0mm · 0.62mm/px · z∈[-179,-111]mm · 5 of 23 slices shown (2 of 2)]
[im 1/23]
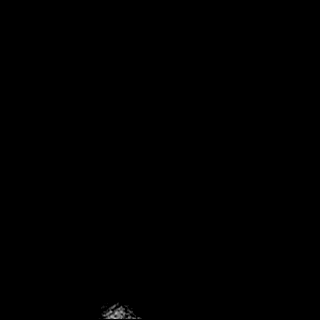
[im 6/23]
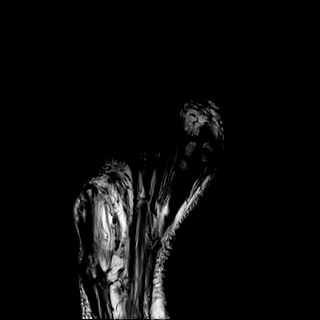
[im 12/23]
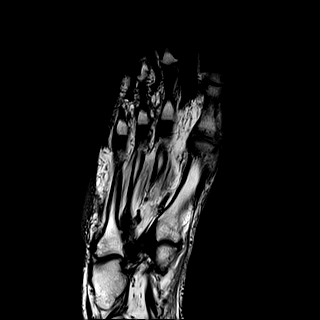
[im 17/23]
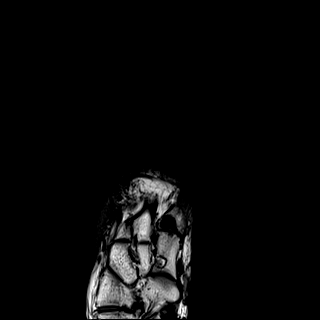
[im 23/23]
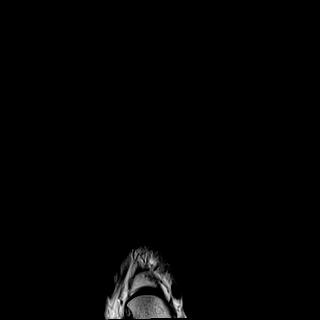

[Series 9: STIR · sagittal · 3.0mm · 0.74mm/px · 7 of 31 slices shown]
[im 1/31]
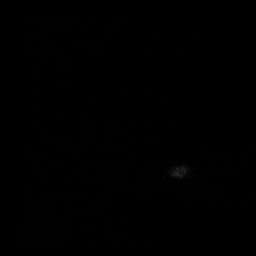
[im 6/31]
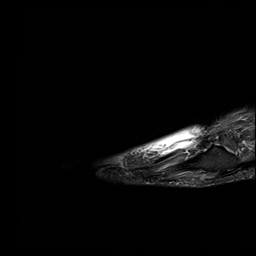
[im 11/31]
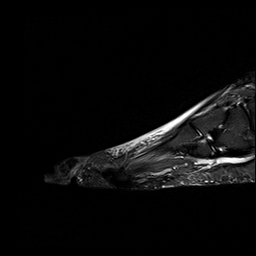
[im 16/31]
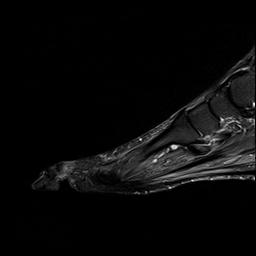
[im 21/31]
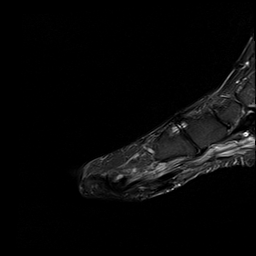
[im 26/31]
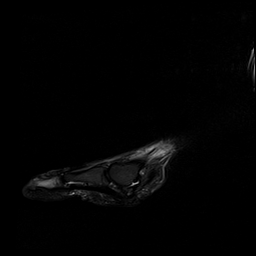
[im 31/31]
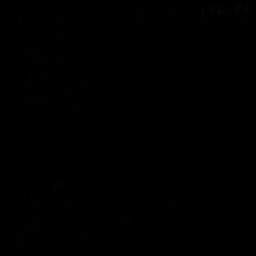

[Series 10: T2 fat-sat · axial · 3.0mm · 0.78mm/px · z∈[-179,-111]mm · 5 of 23 slices shown (2 of 2)]
[im 1/23]
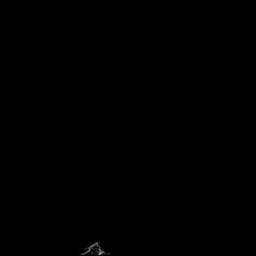
[im 6/23]
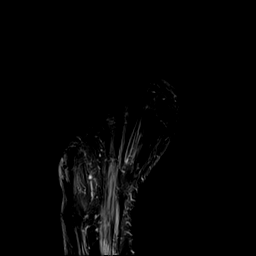
[im 12/23]
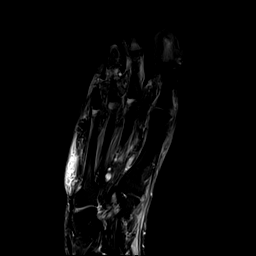
[im 17/23]
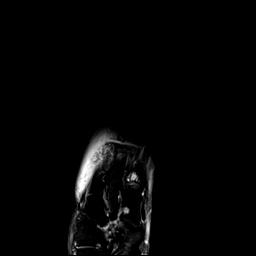
[im 23/23]
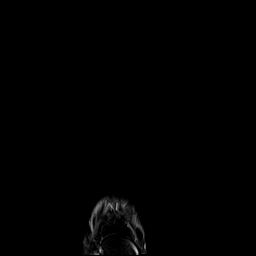

[40 of 40 positions shown; findings below may reference images not displayed]

FINDINGS: Bones/Joint/Cartilage

Erosive changes of the distal tuft of the great toe distal phalanx
with extensive bone marrow edema throughout the distal phalanx,
compatible with acute osteomyelitis. Preserved marrow signal within
the proximal phalanx of the great toe. No significant effusion of
the great toe interphalangeal joint.

Prior fifth ray resection at the level of the distal fifth
metatarsal diaphysis. Preserved marrow signal within the residual
metaphysis. Bipartite tibial hallux sesamoid with mild degenerative
changes. Additional degenerative changes at the first TMT joint.
Osseous structures demonstrate otherwise normal signal. No fracture
or dislocation. No additional sites of erosion or marrow
replacement.

Ligaments

Intact Lisfranc ligament. Collateral ligaments of forefoot appear
intact.

Muscles and Tendons

Chronic denervation changes of the intrinsic foot musculature. No
tendon injury or tenosynovitis.

Soft tissues

Soft tissue swelling of the great toe. Mild nonspecific subcutaneous
edema over the dorsum of the forefoot. No organized fluid
collection.
IMPRESSION: 1. Acute osteomyelitis of the right great toe distal phalanx.
2. Mild nonspecific subcutaneous edema over the dorsum of the
forefoot. No organized fluid collection.

## 2022-09-28 ENCOUNTER — Encounter (INDEPENDENT_AMBULATORY_CARE_PROVIDER_SITE_OTHER): Payer: Medicare Other | Admitting: Ophthalmology
